# Patient Record
Sex: Male | Born: 1975 | Race: Black or African American | Hispanic: No | Marital: Single | Smoking: Never smoker
Health system: Southern US, Community
[De-identification: ages and names within clinical notes are randomized; demographics above are authoritative.]

## PROBLEM LIST (undated history)

## (undated) DIAGNOSIS — I10 Essential (primary) hypertension: Secondary | ICD-10-CM

## (undated) DIAGNOSIS — D849 Immunodeficiency, unspecified: Secondary | ICD-10-CM

## (undated) HISTORY — PX: KNEE SURGERY: SHX244

---

## 2014-10-26 ENCOUNTER — Emergency Department (HOSPITAL_BASED_OUTPATIENT_CLINIC_OR_DEPARTMENT_OTHER)
Admission: EM | Admit: 2014-10-26 | Discharge: 2014-10-26 | Disposition: A | Discharge status: Home / Self Care | Payer: Self-pay | Attending: Emergency Medicine | Admitting: Emergency Medicine

## 2014-10-26 ENCOUNTER — Encounter (HOSPITAL_BASED_OUTPATIENT_CLINIC_OR_DEPARTMENT_OTHER): Payer: Self-pay

## 2014-10-26 DIAGNOSIS — S8992XD Unspecified injury of left lower leg, subsequent encounter: Secondary | ICD-10-CM | POA: Insufficient documentation
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
## 2014-10-26 NOTE — ED Notes (Signed)
Pt verbalizes understanding of d/c instructions and denies any further needs at this time. 
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
## 2018-11-25 ENCOUNTER — Other Ambulatory Visit: Payer: Self-pay

## 2018-11-25 ENCOUNTER — Encounter (HOSPITAL_COMMUNITY): Payer: Self-pay

## 2018-11-25 ENCOUNTER — Ambulatory Visit (HOSPITAL_COMMUNITY)
Admission: EM | Admit: 2018-11-25 | Discharge: 2018-11-25 | Disposition: A | Discharge status: Home / Self Care | Payer: Self-pay | Attending: Family Medicine | Admitting: Family Medicine

## 2018-11-25 DIAGNOSIS — Z202 Contact with and (suspected) exposure to infections with a predominantly sexual mode of transmission: Secondary | ICD-10-CM | POA: Insufficient documentation

## 2018-11-25 NOTE — Discharge Instructions (Addendum)
We are running a trichomonas test.  It should be back tomorrow.  It seems very odd that your partner would all of sudden test positive for trichomonas with a normal test earlier in the pregnancy and you are monogamous (no other partner).  sometimes the test that that is done with a turn around result in an hour is wrong.    At any rate, most people with this minor infection have no symptoms. Often, people try to blame one another when we really don't know when it began or who gave it to whom.  The reason we don't do the one hour test here is that it is not as reliable as the test we are currently running.

## 2018-11-25 NOTE — ED Provider Notes (Signed)
[facility]    CSN: [id number] Arrival date & time: 11/25/18  1321      History   Chief Complaint Chief Complaint  Patient presents with  . SEXUALLY TRANSMITTED DISEASE    HPI Edward Benjamin is a 43 y.o. male.  This is his initial visit to [facility] urgent care. HPI Pt states his partner told him she tested positive for a STD. And he needs to be tested. History reviewed. No pertinent past medical history.  There are no active problems to display for this patient.   History reviewed. No pertinent surgical history.     Home Medications    Prior to Admission medications   Not on File    Family History History reviewed. No pertinent family history.  Social History Social History   Tobacco Use  . Smoking status: Never Smoker  . Smokeless tobacco: Never Used  Substance Use Topics  . Alcohol use: Yes  . Drug use: Never     Allergies   Patient has no known allergies.   Review of Systems Review of Systems  Genitourinary: Negative.   All other systems reviewed and are negative.    Physical Exam Triage Vital Signs ED Triage Vitals [11/25/18 1433]  Enc Vitals Group     BP 137/83     Pulse Rate 63     Resp 18     Temp 98 F (36.7 C)     Temp Source Oral     SpO2      Weight 245 lb (111.1 kg)     Height      Head Circumference      Peak Flow      Pain Score 0     Pain Loc      Pain Edu?      Excl. in GC?    No data found.  Updated Vital Signs BP 137/83 (BP Location: Right Arm)   Pulse 63   Temp 98 F (36.7 C) (Oral)   Resp 18   Wt 111.1 kg   BMI 33.23 kg/m   Physical Exam Vitals signs and nursing note reviewed.  Constitutional:      Appearance: Normal appearance.  Pulmonary:     Effort: Pulmonary effort is normal.  Genitourinary:    Penis: Normal.   Skin:    General: Skin is warm and dry.  Neurological:     General: No focal deficit present.     Mental Status: He is alert and oriented to person, place, and time.   Psychiatric:        Mood and Affect: Mood normal.      UC Treatments / Results  Labs (all labs ordered are listed, but only abnormal results are displayed) Labs Reviewed  CYTOLOGY, (ORAL, ANAL, URETHRAL) ANCILLARY ONLY    EKG   Radiology No results found.  Procedures Procedures (including critical care time)  Medications Ordered in UC Medications - No data to display  Initial Impression / Assessment and Plan / UC Course  I have reviewed the triage vital signs and the nursing notes.  Pertinent labs & imaging results that were available during my care of the patient were reviewed by me and considered in my medical decision making (see chart for details).    Final Clinical Impressions(s) / UC Diagnoses   Final diagnoses:  Possible exposure to STD     Discharge Instructions     We are running a trichomonas test.  It should be back tomorrow.  It seems very odd that your partner would all of sudden test positive for trichomonas with a normal test earlier in the pregnancy and you are monogamous (no other partner).  sometimes the test that that is done with a turn around result in an hour is wrong.    At any rate, most people with this minor infection have no symptoms. Often, people try to blame one another when we really don't know when it began or who gave it to whom.  The reason we don't do the one hour test here is that it is not as reliable as the test we are currently running.    ED Prescriptions    None     I have reviewed the PDMP during this encounter.   Robyn Haber, MD 11/25/18 [phone]

## 2018-11-25 NOTE — ED Triage Notes (Signed)
Pt states his partner told him she tested positive for a STD. And he needs to be tested.

## 2018-11-29 LAB — CYTOLOGY, (ORAL, ANAL, URETHRAL) ANCILLARY ONLY
Chlamydia: NEGATIVE
Neisseria Gonorrhea: NEGATIVE
Trichomonas: NEGATIVE

## 2021-04-01 ENCOUNTER — Encounter (HOSPITAL_BASED_OUTPATIENT_CLINIC_OR_DEPARTMENT_OTHER): Payer: Self-pay

## 2021-04-01 ENCOUNTER — Other Ambulatory Visit: Payer: Self-pay

## 2021-04-01 ENCOUNTER — Emergency Department (HOSPITAL_BASED_OUTPATIENT_CLINIC_OR_DEPARTMENT_OTHER)
Admission: EM | Admit: 2021-04-01 | Discharge: 2021-04-01 | Disposition: A | Discharge status: Home / Self Care | Payer: BC Managed Care – PPO | Attending: Emergency Medicine | Admitting: Emergency Medicine

## 2021-04-01 ENCOUNTER — Emergency Department (HOSPITAL_BASED_OUTPATIENT_CLINIC_OR_DEPARTMENT_OTHER): Payer: BC Managed Care – PPO

## 2021-04-01 DIAGNOSIS — X501XXA Overexertion from prolonged static or awkward postures, initial encounter: Secondary | ICD-10-CM | POA: Insufficient documentation

## 2021-04-01 DIAGNOSIS — S99911A Unspecified injury of right ankle, initial encounter: Secondary | ICD-10-CM | POA: Diagnosis present

## 2021-04-01 DIAGNOSIS — S93401A Sprain of unspecified ligament of right ankle, initial encounter: Secondary | ICD-10-CM | POA: Insufficient documentation

## 2021-04-01 MED ORDER — OXYCODONE-ACETAMINOPHEN 5-325 MG PO TABS
1.0000 | ORAL_TABLET | Freq: Once | ORAL | Status: AC
  Administered 2021-04-01: 1 via ORAL
  Filled 2021-04-01: qty 1

## 2021-04-01 NOTE — Discharge Instructions (Addendum)
Follow-up with orthopedic doctor or sports medicine.  Recommend ice, elevate and rest your ankle.  Would recommend bearing weight as tolerated.  Use crutches as needed.

## 2021-04-01 NOTE — ED Notes (Signed)
Ice applied to the Right ankle. Instructed after 30 or so mins take it off for about 10 mins and then reapply. Also instructed to keep it elevated.

## 2021-04-01 NOTE — ED Triage Notes (Addendum)
Pt states he twisted right ankle ~645pm

## 2021-04-01 NOTE — ED Provider Notes (Signed)
[facility] EMERGENCY DEPARTMENT Provider Note   CSN: [id number] Arrival date & time: 04/01/21  2029     History  Chief Complaint  Patient presents with   Ankle Injury    Edward Benjamin is a 46 y.o. male.  Presents to ER with concern for ankle injury.  Patient reports that he twisted his ankle around 6:45 PM.  Right ankle.  Noted sudden onset pain and some swelling.  Pain is worse to the outside of his ankle.  Pain is worse with movement improved with rest.  No alleviating or other aggravating factors.  Denies prior injury to this ankle.  HPI     Home Medications Prior to Admission medications   Not on File      Allergies    Kale and Spinach    Review of Systems   Review of Systems  Musculoskeletal:  Positive for arthralgias.  All other systems reviewed and are negative.  Physical Exam Updated Vital Signs BP (!) 136/97 (BP Location: Left Arm)    Pulse 82    Temp 98.2 F (36.8 C) (Oral)    Resp 18    Ht 6' (1.829 m)    Wt 118.4 kg    SpO2 99%    BMI 35.40 kg/m  Physical Exam Vitals and nursing note reviewed.  Constitutional:      General: He is not in acute distress.    Appearance: He is well-developed.  HENT:     Head: Normocephalic and atraumatic.  Eyes:     Conjunctiva/sclera: Conjunctivae normal.  Cardiovascular:     Rate and Rhythm: Normal rate and regular rhythm.     Heart sounds: No murmur heard. Pulmonary:     Effort: Pulmonary effort is normal. No respiratory distress.  Musculoskeletal:     Cervical back: Neck supple.     Comments: Right lower extremity: There is swelling and tenderness to the lateral malleolus of the right ankle, normal DP and PT pulses, no tenderness to foot, remainder of lower leg or knee or upper leg.  Skin:    General: Skin is warm and dry.     Capillary Refill: Capillary refill takes less than 2 seconds.  Neurological:     Mental Status: He is alert.  Psychiatric:        Mood and Affect: Mood normal.    ED Results  / Procedures / Treatments   Labs (all labs ordered are listed, but only abnormal results are displayed) Labs Reviewed - No data to display  EKG None  Radiology DG Ankle Complete Right  Result Date: 04/01/2021 CLINICAL DATA:  Status post trauma. EXAM: RIGHT ANKLE - COMPLETE 3+ VIEW COMPARISON:  None. FINDINGS: There is no evidence of fracture, dislocation, or joint effusion. There is no evidence of arthropathy or other focal bone abnormality. There is mild anterior and lateral soft tissue swelling. IMPRESSION: Mild anterior and lateral soft tissue swelling, without an acute osseous abnormality. Electronically Signed   By: Aram Candela M.D.   On: 04/01/2021 21:20    Procedures Procedures    Medications Ordered in ED Medications  oxyCODONE-acetaminophen (PERCOCET/ROXICET) 5-325 MG per tablet 1 tablet (1 tablet Oral Given 04/01/21 2315)    ED Course/ Medical Decision Making/ A&P                           Medical Decision Making Amount and/or Complexity of Data Reviewed Radiology: ordered.  Risk Prescription drug management.   46 year old  presenting to ER with concern for isolated right ankle injury.  Mechanical, no LOC.  Noted mild swelling and tenderness.  X-ray independently reviewed by myself.  Reviewed radiology report.  No acute fracture or dislocation.  Suspect strain, sprain.  Will place in ankle brace ASO and provide crutches.  Recommend RICE, f/u with sports med or ortho.     After the discussed management above, the patient was determined to be safe for discharge.  The patient was in agreement with this plan and all questions regarding their care were answered.  ED return precautions were discussed and the patient will return to the ED with any significant worsening of condition.         Final Clinical Impression(s) / ED Diagnoses Final diagnoses:  Sprain of right ankle, unspecified ligament, initial encounter    Rx / DC Orders ED Discharge Orders      None         Milagros Loll, MD 04/02/21 1724

## 2021-04-03 ENCOUNTER — Encounter: Payer: Self-pay | Admitting: Family Medicine

## 2021-04-03 ENCOUNTER — Ambulatory Visit (INDEPENDENT_AMBULATORY_CARE_PROVIDER_SITE_OTHER): Payer: BC Managed Care – PPO | Admitting: Family Medicine

## 2021-04-03 ENCOUNTER — Ambulatory Visit: Payer: Self-pay

## 2021-04-03 VITALS — BP 150/90 | Ht 72.0 in | Wt 261.0 lb

## 2021-04-03 DIAGNOSIS — S93491A Sprain of other ligament of right ankle, initial encounter: Secondary | ICD-10-CM

## 2021-04-03 DIAGNOSIS — M958 Other specified acquired deformities of musculoskeletal system: Secondary | ICD-10-CM | POA: Insufficient documentation

## 2021-04-03 DIAGNOSIS — R937 Abnormal findings on diagnostic imaging of other parts of musculoskeletal system: Secondary | ICD-10-CM | POA: Insufficient documentation

## 2021-04-03 DIAGNOSIS — M25571 Pain in right ankle and joints of right foot: Secondary | ICD-10-CM

## 2021-04-03 MED ORDER — IBUPROFEN 600 MG PO TABS: 600.0000 mg | ORAL_TABLET | Freq: Three times a day (TID) | ORAL | 1 refills | Status: AC | PRN

## 2021-04-03 MED ORDER — MELOXICAM 7.5 MG PO TABS: 7.5000 mg | ORAL_TABLET | Freq: Two times a day (BID) | ORAL | 1 refills | Status: DC | PRN

## 2021-04-03 NOTE — Progress Notes (Signed)
°  Edward Benjamin - 46 y.o. male MRN [id number]  Date of birth: 09-20-1975  SUBJECTIVE:  Including CC & ROS.  No chief complaint on file.   Edward Benjamin is a 46 y.o. male that is presenting with acute right ankle pain.  He had a plantarflexed injury.  He is having pain more in the distal tibia.  Has pain with weightbearing.  No history of surgery.  Review of the emergency department note from 2/22 shows he was provided crutches. Independent review of the right ankle x-ray from 2/22 shows soft tissue swelling.   Review of Systems See HPI   HISTORY: Past Medical, Surgical, Social, and Family History Reviewed & Updated per EMR.   Pertinent Historical Findings include:  History reviewed. No pertinent past medical history.  Past Surgical History:  Procedure Laterality Date   KNEE SURGERY       PHYSICAL EXAM:  VS: BP (!) 150/90 (BP Location: Left Arm, Patient Position: Sitting)    Ht 6' (1.829 m)    Wt 261 lb (118.4 kg)    BMI 35.40 kg/m  Physical Exam Gen: NAD, alert, cooperative with exam, well-appearing MSK:  Neurovascularly intact    Limited ultrasound: Right foot and ankle :  No ankle effusion. Changes of the ATFL is present. No changes of the peroneal tendon or posterior tibialis. Increased hyperemia at the syndesmosis to suspect high ankle sprain.  Summary: Findings consistent with high ankle sprain  Ultrasound and interpretation by Clare Gandy, MD    ASSESSMENT & PLAN:   High ankle sprain of right lower extremity Acutely occurring.  Symptoms were consistent with a high ankle sprain. -Counseled on home exercise therapy and supportive care. -Cam walker. -Provided work note. -Ibuprofen. -Follow-up in 2 weeks.

## 2021-04-03 NOTE — Patient Instructions (Signed)
Nice to meet you Please try the boot  Please use the mobic as needed  Please ice 2-3 times per day for 20-3- minutes  Please try the range of motion movements.   Please send me a message in MyChart with any questions or updates.  Please see me back in 2 weeks.   --Dr. Raeford Razor

## 2021-04-03 NOTE — Assessment & Plan Note (Signed)
Acutely occurring.  Symptoms were consistent with a high ankle sprain. -Counseled on home exercise therapy and supportive care. -Cam walker. -Provided work note. -Ibuprofen. -Follow-up in 2 weeks.

## 2021-04-17 ENCOUNTER — Ambulatory Visit (HOSPITAL_BASED_OUTPATIENT_CLINIC_OR_DEPARTMENT_OTHER)
Admission: RE | Admit: 2021-04-17 | Discharge: 2021-04-17 | Disposition: A | Discharge status: Home / Self Care | Payer: BC Managed Care – PPO | Source: Ambulatory Visit | Attending: Family Medicine | Admitting: Family Medicine

## 2021-04-17 ENCOUNTER — Other Ambulatory Visit: Payer: Self-pay

## 2021-04-17 ENCOUNTER — Encounter: Payer: Self-pay | Admitting: Family Medicine

## 2021-04-17 ENCOUNTER — Ambulatory Visit (INDEPENDENT_AMBULATORY_CARE_PROVIDER_SITE_OTHER): Payer: BC Managed Care – PPO | Admitting: Family Medicine

## 2021-04-17 VITALS — BP 140/90 | Ht 72.0 in | Wt 261.0 lb

## 2021-04-17 DIAGNOSIS — M958 Other specified acquired deformities of musculoskeletal system: Secondary | ICD-10-CM

## 2021-04-17 NOTE — Progress Notes (Signed)
?  Edward Benjamin - 46 y.o. male MRN [id number]  Date of birth: [birth year] ? ?SUBJECTIVE:  Including CC & ROS.  ?No chief complaint on file. ? ? ?Edward Benjamin is a 46 y.o. male that is presenting with worsening of his right ankle pain.  He is unable to bear weight without significant pain.  He has no limited range of motion of his ankle.  He continues to use the crutches in the cam walker. ? ? ? ?Review of Systems ?See HPI  ? ?HISTORY: Past Medical, Surgical, Social, and Family History Reviewed & Updated per EMR.   ?Pertinent Historical Findings include: ? ?History reviewed. No pertinent past medical history. ? ?Past Surgical History:  ?Procedure Laterality Date  ? KNEE SURGERY    ? ? ? ?PHYSICAL EXAM:  ?VS: BP 140/90 (BP Location: Left Arm, Patient Position: Sitting)   Ht 6' (1.829 m)   Wt 261 lb (118.4 kg)   BMI 35.40 kg/m?  ?Physical Exam ?Gen: NAD, alert, cooperative with exam, well-appearing ?MSK:  ?Right ankle: ?Effusion within the ankle joint. ?Limited plantarflexion and dorsiflexion. ?Tenderness to palpation over the ankle joint. ?Translation with anterior drawer. ?Neurovascularly intact   ? ? ? ? ?ASSESSMENT & PLAN:  ? ?Osteochondral defect of ankle ?Still having severe significant pain.  Is unable to bear weight after his trauma from a few weeks ago.  Concern for an osteochondral defect or nondisplaced fracture given his severity of his pain. ?-Counseled on home exercise therapy and supportive care. ?-Continue cam walker and crutches. ?-X-ray. ?-MRI of the right ankle to evaluate for osteochondral defect. ? ? ? ? ?

## 2021-04-17 NOTE — Assessment & Plan Note (Signed)
Still having severe significant pain.  Is unable to bear weight after his trauma from a few weeks ago.  Concern for an osteochondral defect or nondisplaced fracture given his severity of his pain. ?-Counseled on home exercise therapy and supportive care. ?-Continue cam walker and crutches. ?-X-ray. ?-MRI of the right ankle to evaluate for osteochondral defect. ?

## 2021-04-17 NOTE — Patient Instructions (Signed)
Good to see you ?Please continue the boot  ?I will call with the results from today  ?Please call [phone] to schedule the MRI   ?Please send me a message in MyChart with any questions or updates.  ?We'll setup a virtual visit once the MRI is resulted.  ? ?--Dr. Jordan Likes ? ?

## 2021-04-20 ENCOUNTER — Telehealth: Payer: Self-pay | Admitting: Family Medicine

## 2021-04-20 NOTE — Telephone Encounter (Signed)
Informed of results.  ? ?Myra Rude, MD ?[facility] ?04/20/2021, 4:53 PM ? ?

## 2021-04-28 ENCOUNTER — Other Ambulatory Visit: Payer: BC Managed Care – PPO

## 2021-04-29 ENCOUNTER — Other Ambulatory Visit: Payer: Self-pay

## 2021-04-29 ENCOUNTER — Ambulatory Visit
Admission: RE | Admit: 2021-04-29 | Discharge: 2021-04-29 | Disposition: A | Discharge status: Home / Self Care | Payer: BC Managed Care – PPO | Source: Ambulatory Visit | Attending: Family Medicine | Admitting: Family Medicine

## 2021-04-29 DIAGNOSIS — M958 Other specified acquired deformities of musculoskeletal system: Secondary | ICD-10-CM

## 2021-05-04 ENCOUNTER — Encounter: Payer: Self-pay | Admitting: Family Medicine

## 2021-05-04 ENCOUNTER — Telehealth (INDEPENDENT_AMBULATORY_CARE_PROVIDER_SITE_OTHER): Payer: BC Managed Care – PPO | Admitting: Family Medicine

## 2021-05-04 DIAGNOSIS — R937 Abnormal findings on diagnostic imaging of other parts of musculoskeletal system: Secondary | ICD-10-CM | POA: Diagnosis not present

## 2021-05-04 NOTE — Progress Notes (Signed)
Virtual Visit via Video Note ? ?I connected with Edward Benjamin on 05/04/21 at  1:10 PM EDT by a video enabled telemedicine application and verified that I am speaking with the correct person using two identifiers. ? ?Location: ?Patient: home ?Provider: office ?  ?I discussed the limitations of evaluation and management by telemedicine and the availability of in person appointments. The patient expressed understanding and agreed to proceed. ? ?History of Present Illness: ? ?Mr. Edward Benjamin is a 46 year old that is following up after the MRI of his right ankle.  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  It is also showing subchondral marrow edema and cystic changes of the tibial plafond. ?  ?Observations/Objective: ? ? ?Assessment and Plan: ? ?Subchondral marrow edema of right tibial plafond.: ?Initial injury on 2/22.  MRI was revealing for subchondral marrow edema within the tibial plafond.  This is likely the source of his pain with his mechanism of injury.  The chronically torn ligaments are less likely as a source. ?-Counseled on home exercise therapy and supportive care. ?-Allow him to rehab from home for another 2 weeks.  If at that time continues to have pain we can consider injection. ? ?Follow Up Instructions: ? ?  ?I discussed the assessment and treatment plan with the patient. The patient was provided an opportunity to ask questions and all were answered. The patient agreed with the plan and demonstrated an understanding of the instructions. ?  ?The patient was advised to call back or seek an in-person evaluation if the symptoms worsen or if the condition fails to improve as anticipated. ? ? ? ?Clearance Coots, MD ? ? ?

## 2021-05-04 NOTE — Assessment & Plan Note (Signed)
Initial injury on 2/22.  MRI was revealing for subchondral marrow edema within the tibial plafond.  This is likely the source of his pain with his mechanism of injury.  The chronically torn ligaments are less likely as a source. ?-Counseled on home exercise therapy and supportive care. ?-Allow him to rehab from home for another 2 weeks.  If at that time continues to have pain we can consider injection. ?

## 2021-05-06 ENCOUNTER — Ambulatory Visit (INDEPENDENT_AMBULATORY_CARE_PROVIDER_SITE_OTHER): Payer: BC Managed Care – PPO | Admitting: Family Medicine

## 2021-05-06 ENCOUNTER — Encounter: Payer: Self-pay | Admitting: Family Medicine

## 2021-05-06 ENCOUNTER — Ambulatory Visit: Payer: Self-pay

## 2021-05-06 VITALS — BP 138/98 | Ht 72.0 in | Wt 261.0 lb

## 2021-05-06 DIAGNOSIS — M25511 Pain in right shoulder: Secondary | ICD-10-CM

## 2021-05-06 DIAGNOSIS — M778 Other enthesopathies, not elsewhere classified: Secondary | ICD-10-CM

## 2021-05-06 NOTE — Assessment & Plan Note (Signed)
Acutely occurring.  Symptoms seem more consistent with a capsulitis with the changes appreciated on ultrasound. ?-Counseled on home exercise therapy and supportive care. ?-Could consider physical therapy or shockwave therapy. ?

## 2021-05-06 NOTE — Progress Notes (Signed)
?  Edward Benjamin - 46 y.o. male MRN [id number]  Date of birth: 1975/05/29 ? ?SUBJECTIVE:  Including CC & ROS.  ?No chief complaint on file. ? ? ?Edward Benjamin is a 46 y.o. male that is presenting with acute on chronic right shoulder pain.  The pain is anterior in nature.  The pain is worse with overhead activities.  The pain also occurs when he sleeps on the affected side.  It was worse with repetitive activities that he did overhead at work. ? ? ?Review of Systems ?See HPI  ? ?HISTORY: Past Medical, Surgical, Social, and Family History Reviewed & Updated per EMR.   ?Pertinent Historical Findings include: ? ?History reviewed. No pertinent past medical history. ? ?Past Surgical History:  ?Procedure Laterality Date  ? KNEE SURGERY    ? ? ? ?PHYSICAL EXAM:  ?VS: BP (!) 138/98 (BP Location: Left Arm, Patient Position: Sitting)   Ht 6' (1.829 m)   Wt 261 lb (118.4 kg)   BMI 35.40 kg/m?  ?Physical Exam ?Gen: NAD, alert, cooperative with exam, well-appearing ?MSK:  ?Neurovascularly intact   ? ?Limited ultrasound: Right shoulder: ? ?Normal-appearing biceps tendon. ?Normal-appearing subscapularis. ?There is increased thickening at the insertion of the biceps tendon into the capsule. ?Normal-appearing posterior glenohumeral joint. ? ?Summary: Findings most consistent with capsulitis. ? ?Ultrasound and interpretation by Clare Gandy, MD ? ? ? ?ASSESSMENT & PLAN:  ? ?Capsulitis of right shoulder ?Acutely occurring.  Symptoms seem more consistent with a capsulitis with the changes appreciated on ultrasound. ?-Counseled on home exercise therapy and supportive care. ?-Could consider physical therapy or shockwave therapy. ? ? ? ? ?

## 2021-05-06 NOTE — Patient Instructions (Signed)
Good to see you ?Please try heat before exercise and ice after  ?Please try the exercise  ?Please focus on building the strength of your back   ?Please send me a message in MyChart with any questions or updates.  ?Please see me back in 4 weeks.  ? ?--Dr. Jordan Likes ? ?

## 2021-05-18 ENCOUNTER — Ambulatory Visit (INDEPENDENT_AMBULATORY_CARE_PROVIDER_SITE_OTHER): Payer: BC Managed Care – PPO | Admitting: Family Medicine

## 2021-05-18 ENCOUNTER — Ambulatory Visit: Payer: Self-pay

## 2021-05-18 ENCOUNTER — Ambulatory Visit: Payer: BC Managed Care – PPO | Admitting: Family Medicine

## 2021-05-18 VITALS — BP 138/90 | Ht 72.0 in | Wt 260.0 lb

## 2021-05-18 DIAGNOSIS — M19071 Primary osteoarthritis, right ankle and foot: Secondary | ICD-10-CM

## 2021-05-18 MED ORDER — TRIAMCINOLONE ACETONIDE 40 MG/ML IJ SUSP
40.0000 mg | Freq: Once | INTRAMUSCULAR | Status: AC
  Administered 2021-05-18: 40 mg via INTRA_ARTICULAR

## 2021-05-18 NOTE — Progress Notes (Signed)
?  Edward Benjamin - 46 y.o. male MRN [id number]  Date of birth: 1975-08-10 ? ?SUBJECTIVE:  Including CC & ROS.  ?No chief complaint on file. ? ? ?Edward Benjamin is a 46 y.o. male that is presenting with worsening of his right ankle pain.  He is not able to bear weight.  He continues to have pain with any plantarflexion or dorsiflexion.  MRI was showing degenerative changes within the ankle joint. ? ? ?Review of Systems ?See HPI  ? ?HISTORY: Past Medical, Surgical, Social, and Family History Reviewed & Updated per EMR.   ?Pertinent Historical Findings include: ? ?No past medical history on file. ? ?Past Surgical History:  ?Procedure Laterality Date  ? KNEE SURGERY    ? ? ? ?PHYSICAL EXAM:  ?VS: BP 138/90   Ht 6' (1.829 m)   Wt 260 lb (117.9 kg)   BMI 35.26 kg/m?  ?Physical Exam ?Gen: NAD, alert, cooperative with exam, well-appearing ?MSK:  ?Neurovascularly intact   ? ? ?Aspiration/Injection Procedure Note ?Elissa Hefty ?[birth year] ? ?Procedure: Injection ?Indications: Right ankle pain ? ?Procedure Details ?Consent: Risks of procedure as well as the alternatives and risks of each were explained to the (patient/caregiver).  Consent for procedure obtained. ?Time Out: Verified patient identification, verified procedure, site/side was marked, verified correct patient position, special equipment/implants available, medications/allergies/relevent history reviewed, required imaging and test results available.  Performed.  The area was cleaned with iodine and alcohol swabs.   ? ?The right ankle joint was injected using 2 cc of 1% lidocaine on a 25 1-1/2 inch needle.  The syringe was switched and a mixture containing 1 cc's of 40 mg Kenalog and 2 cc's of 0.25% bupivacaine was injected.  Ultrasound was used. Images were obtained in long views showing the injection.   ? ? ?A sterile dressing was applied. ? ?Patient did tolerate procedure well. ? ? ? ? ?ASSESSMENT & PLAN:  ? ?Primary osteoarthritis of right ankle ?Pain is acutely  worsening.  The MRI was showing moderate to high-grade thinning of the far posterior tibial plafond cartilage. Pain occurring with weight bearing and movements.  ?-Counseled on home exercise therapy and supportive care. ?-Injection today. ?-May need consider referral to surgery ? ? ? ? ?

## 2021-05-18 NOTE — Assessment & Plan Note (Signed)
Pain is acutely worsening.  The MRI was showing moderate to high-grade thinning of the far posterior tibial plafond cartilage. Pain occurring with weight bearing and movements.  ?-Counseled on home exercise therapy and supportive care. ?-Injection today. ?-May need consider referral to surgery ?

## 2021-05-18 NOTE — Patient Instructions (Signed)
Good to see you ?Please use ice as needed   ?Please send me a message in MyChart with any questions or updates.  ?Please see me back in 2-3 weeks.  ? ?--Dr. Jordan Likes ? ?

## 2021-06-04 ENCOUNTER — Encounter: Payer: Self-pay | Admitting: Family Medicine

## 2021-06-04 ENCOUNTER — Ambulatory Visit (INDEPENDENT_AMBULATORY_CARE_PROVIDER_SITE_OTHER): Payer: Self-pay | Admitting: Family Medicine

## 2021-06-04 VITALS — BP 148/116 | Ht 72.0 in | Wt 260.0 lb

## 2021-06-04 DIAGNOSIS — M19071 Primary osteoarthritis, right ankle and foot: Secondary | ICD-10-CM

## 2021-06-04 NOTE — Progress Notes (Signed)
?  Edward Benjamin - 46 y.o. male MRN [id number]  Date of birth: [birth year] ? ?SUBJECTIVE:  Including CC & ROS.  ?No chief complaint on file. ? ? ?Edward Benjamin is a 46 y.o. male that is following up for his right ankle pain.  He continues to have pain and is unable to bear weight.  We have tried therapy as well as injection.  He continues to use crutches. ? ? ?Review of Systems ?See HPI  ? ?HISTORY: Past Medical, Surgical, Social, and Family History Reviewed & Updated per EMR.   ?Pertinent Historical Findings include: ? ?History reviewed. No pertinent past medical history. ? ?Past Surgical History:  ?Procedure Laterality Date  ? KNEE SURGERY    ? ? ? ?PHYSICAL EXAM:  ?VS: BP (!) 148/116 (BP Location: Left Arm, Patient Position: Sitting)   Ht 6' (1.829 m)   Wt 260 lb (117.9 kg)   BMI 35.26 kg/m?  ?Physical Exam ?Gen: NAD, alert, cooperative with exam, well-appearing ?MSK:  ?Neurovascularly intact   ? ? ? ? ?ASSESSMENT & PLAN:  ? ?Primary osteoarthritis of right ankle ?Pain still occurring since February after his initial injury.  We have tried therapy, crutches with a cam walker as well as injection pain is still ongoing. ?-Counseled on home exercise therapy and supportive care. ?-Referral to orthopedic surgery. ? ? ? ? ?

## 2021-06-04 NOTE — Patient Instructions (Signed)
Good to see you ?Please use ice as needed  ?Please use crutches if painful  ?I have referred you to the surgeon   ?Please send me a message in MyChart with any questions or updates.  ?Please see me back as needed.  ? ?--Dr. Jordan Likes ? ?

## 2021-06-04 NOTE — Assessment & Plan Note (Signed)
Pain still occurring since February after his initial injury.  We have tried therapy, crutches with a cam walker as well as injection pain is still ongoing. ?-Counseled on home exercise therapy and supportive care. ?-Referral to orthopedic surgery. ?

## 2021-06-05 ENCOUNTER — Ambulatory Visit: Payer: BC Managed Care – PPO | Admitting: Family Medicine

## 2021-06-11 ENCOUNTER — Ambulatory Visit: Payer: Self-pay | Admitting: Family Medicine

## 2021-06-15 ENCOUNTER — Other Ambulatory Visit: Payer: Self-pay

## 2021-06-15 ENCOUNTER — Ambulatory Visit (INDEPENDENT_AMBULATORY_CARE_PROVIDER_SITE_OTHER): Payer: Self-pay | Admitting: Orthopedic Surgery

## 2021-06-15 DIAGNOSIS — M19171 Post-traumatic osteoarthritis, right ankle and foot: Secondary | ICD-10-CM

## 2021-06-16 ENCOUNTER — Encounter: Payer: Self-pay | Admitting: Orthopedic Surgery

## 2021-06-16 DIAGNOSIS — M19171 Post-traumatic osteoarthritis, right ankle and foot: Secondary | ICD-10-CM

## 2021-06-16 MED ORDER — LIDOCAINE HCL 1 % IJ SOLN
2.0000 mL | INTRAMUSCULAR | Status: AC | PRN
  Administered 2021-06-16: 2 mL

## 2021-06-16 MED ORDER — METHYLPREDNISOLONE ACETATE 40 MG/ML IJ SUSP
40.0000 mg | INTRAMUSCULAR | Status: AC | PRN
  Administered 2021-06-16: 40 mg via INTRA_ARTICULAR

## 2021-06-16 NOTE — Progress Notes (Signed)
? ?Office Visit Note ?  ?Patient: Edward Benjamin           ?Date of Birth: Nov 18, 1975           ?MRN: [id number] ?Visit Date: 06/15/2021 ?             ?Requested by: Myra Rude, MD ?[address] ?[address] ?[place],  [place] [address] ?PCP: Patient, No Pcp Per (Inactive) ? ?Chief Complaint  ?Patient presents with  ? Right Ankle - Pain  ? ? ? ? ?HPI: ?Patient is a 46 year old gentleman who is seen for initial evaluation for right ankle pain.  Patient states he is unable to bear weight he has been through therapy and several injections.  He is not sure where the injections were.  He states that this started with a twisting of his ankle in February.  He did have a MRI scan of the ankle March 22.  Patient currently uses crutches for ambulation. ? ?Assessment & Plan: ?Visit Diagnoses:  ?1. Post-traumatic osteoarthritis, right ankle and foot   ? ? ?Plan: The right ankle was injected from the anterior medial portal we will see how he does with this injection.  Discussed patient may benefit from arthroscopic debridement. ? ?Follow-Up Instructions: Return in about 3 weeks (around 07/06/2021).  ? ?Ortho Exam ? ?Patient is alert, oriented, no adenopathy, well-dressed, normal affect, normal respiratory effort. ?Examination patient has a good dorsalis pedis pulse he has swelling over the lateral joint line of the ankle.  The syndesmosis is nontender to palpation or compression the peroneal and posterior tibial tendons are nontender to palpation or with resisted range of motion the anterior tibial tendon is nontender to palpation.  He is point tender to palpation anteriorly over the ankle. ? ?Imaging: ?No results found. ?No images are attached to the encounter. ? ?Labs: ?No results found for: HGBA1C, ESRSEDRATE, CRP, LABURIC, REPTSTATUS, GRAMSTAIN, CULT, LABORGA ? ? ?No results found for: ALBUMIN, PREALBUMIN, CBC ? ?No results found for: MG ?No results found for: VD25OH ? ?No results found for: PREALBUMIN ?   ? View : No  data to display.  ?  ?  ?  ? ? ? ?There is no height or weight on file to calculate BMI. ? ?Orders:  ?No orders of the defined types were placed in this encounter. ? ?No orders of the defined types were placed in this encounter. ? ? ? Procedures: ?Medium Joint Inj: R ankle on 06/16/2021 9:45 AM ?Indications: pain and diagnostic evaluation ?Details: 22 G 1.5 in needle, anteromedial approach ?Medications: 2 mL lidocaine 1 %; 40 mg methylPREDNISolone acetate 40 MG/ML ?Outcome: tolerated well, no immediate complications ?Procedure, treatment alternatives, risks and benefits explained, specific risks discussed. Consent was given by the patient. Immediately prior to procedure a time out was called to verify the correct patient, procedure, equipment, support staff and site/side marked as required. Patient was prepped and draped in the usual sterile fashion.  ? ? ? ?Clinical Data: ?No additional findings. ? ?ROS: ? ?All other systems negative, except as noted in the HPI. ?Review of Systems ? ?Objective: ?Vital Signs: There were no vitals taken for this visit. ? ?Specialty Comments:  ?No specialty comments available. ? ?PMFS History: ?Patient Active Problem List  ? Diagnosis Date Noted  ? Primary osteoarthritis of right ankle 05/18/2021  ? Capsulitis of right shoulder 05/06/2021  ? Bone marrow edema 04/03/2021  ? ?History reviewed. No pertinent past medical history.  ?History reviewed. No pertinent family history.  ?  Past Surgical History:  ?Procedure Laterality Date  ? KNEE SURGERY    ? ?Social History  ? ?Occupational History  ? Not on file  ?Tobacco Use  ? Smoking status: Never  ? Smokeless tobacco: Never  ?Vaping Use  ? Vaping Use: Never used  ?Substance and Sexual Activity  ? Alcohol use: Not Currently  ? Drug use: Never  ? Sexual activity: Yes  ? ? ? ? ? ?

## 2021-07-07 ENCOUNTER — Ambulatory Visit (INDEPENDENT_AMBULATORY_CARE_PROVIDER_SITE_OTHER): Payer: Self-pay | Admitting: Orthopedic Surgery

## 2021-07-07 DIAGNOSIS — M19171 Post-traumatic osteoarthritis, right ankle and foot: Secondary | ICD-10-CM

## 2021-07-08 ENCOUNTER — Encounter: Payer: Self-pay | Admitting: Orthopedic Surgery

## 2021-07-08 NOTE — Progress Notes (Signed)
   Office Visit Note   Patient: Edward Benjamin           Date of Birth: 05-08-1975           MRN: [id number] Visit Date: 07/07/2021              Requested by: No referring provider defined for this encounter. PCP: Patient, No Pcp Per (Inactive)  Chief Complaint  Patient presents with   Right Ankle - Follow-up      HPI: Patient is a 46 year old gentleman who is seen in follow-up for traumatic arthritis right ankle.  Patient states that his ankle felt worse after the steroid injection.  Patient states that he was initially told he had a hairline fracture.  Assessment & Plan: Visit Diagnoses:  1. Post-traumatic osteoarthritis, right ankle and foot     Plan: With the patient's persistent symptoms localized to the tibial talar joint and with the MRI scan findings consistent with osteochondral defects of the tibial talar joint I have recommended proceeding with arthroscopy for debridement of the ankle joint.  Risk and benefits were discussed including persistent pain infection need for additional surgery.  Patient states he understands wished to proceed at this time.  Discussed that our goal would be approximately 75% pain relief.  Follow-Up Instructions: Return in about 2 weeks (around 07/21/2021).   Ortho Exam  Patient is alert, oriented, no adenopathy, well-dressed, normal affect, normal respiratory effort. Examination patient has pain reproduced with palpation of the anterior tibial talar joint.  He has good subtalar and ankle range of motion.  Review of the MRI scan shows no evidence of any stress fractures no syndesmotic injury patient does have osteochondral defects of the tibial talar joint.  Anterior drawer is stable with no ligamentous instability of the anterior talofibular ligament.  Imaging: No results found. No images are attached to the encounter.  Labs: No results found for: HGBA1C, ESRSEDRATE, CRP, LABURIC, REPTSTATUS, GRAMSTAIN, CULT, LABORGA   No results found for:  ALBUMIN, PREALBUMIN, CBC  No results found for: MG No results found for: VD25OH  No results found for: PREALBUMIN     View : No data to display.           There is no height or weight on file to calculate BMI.  Orders:  No orders of the defined types were placed in this encounter.  No orders of the defined types were placed in this encounter.    Procedures: No procedures performed  Clinical Data: No additional findings.  ROS:  All other systems negative, except as noted in the HPI. Review of Systems  Objective: Vital Signs: There were no vitals taken for this visit.  Specialty Comments:  No specialty comments available.  PMFS History: Patient Active Problem List   Diagnosis Date Noted   Primary osteoarthritis of right ankle 05/18/2021   Capsulitis of right shoulder 05/06/2021   Bone marrow edema 04/03/2021   History reviewed. No pertinent past medical history.  History reviewed. No pertinent family history.  Past Surgical History:  Procedure Laterality Date   KNEE SURGERY     Social History   Occupational History   Not on file  Tobacco Use   Smoking status: Never   Smokeless tobacco: Never  Vaping Use   Vaping Use: Never used  Substance and Sexual Activity   Alcohol use: Not Currently   Drug use: Never   Sexual activity: Yes

## 2021-07-27 ENCOUNTER — Ambulatory Visit: Payer: Self-pay | Admitting: Orthopedic Surgery

## 2021-07-30 ENCOUNTER — Encounter (HOSPITAL_COMMUNITY): Payer: Self-pay | Admitting: Orthopedic Surgery

## 2021-07-30 ENCOUNTER — Other Ambulatory Visit: Payer: Self-pay

## 2021-07-30 NOTE — Progress Notes (Addendum)
PCP - pt denies Cardiologist - pt denies   Anesthesia review: n/a  -------------  SDW INSTRUCTIONS:  Your procedure is scheduled on Friday 6/23 . Please report to [facility] Main Entrance "A" at 09:30 A.M., and check in at the Admitting office. Call this number if you have problems the morning of surgery: [phone]   Remember: Do not eat after midnight the night before your surgery  Clear liquids until 0900 the morning of surgery: clear liquids include water, cranberry/apple juice, black coffee, clear tea - please do not add anything to your drinks   Medications to take morning of surgery with a sip of water include: NONE  As of today, STOP taking any Aspirin (unless otherwise instructed by your surgeon), Aleve, Naproxen, Ibuprofen, Motrin, Advil, Goody's, BC's, all herbal medications, fish oil, and all vitamins.    The Morning of Surgery Do not wear jewelry Do not wear lotions, powders, colognes, or deodorant Do not bring valuables to the hospital. [facility] is not responsible for any belongings or valuables.  If you are a smoker, DO NOT Smoke 24 hours prior to surgery  If you wear a CPAP at night please bring your mask the morning of surgery   Remember that you must have someone to transport you home after your surgery, and remain with you for 24 hours if you are discharged the same day.  Please bring cases for contacts, glasses, hearing aids, dentures or bridgework because it cannot be worn into surgery.   Patients discharged the day of surgery will not be allowed to drive home.   Please shower the NIGHT BEFORE/MORNING OF SURGERY (use antibacterial soap like DIAL soap if possible). Wear comfortable clothes the morning of surgery. Oral Hygiene is also important to reduce your risk of infection.  Remember - BRUSH YOUR TEETH THE MORNING OF SURGERY WITH YOUR REGULAR TOOTHPASTE  Patient denies shortness of breath, fever, cough and chest pain.

## 2021-07-31 ENCOUNTER — Ambulatory Visit (HOSPITAL_COMMUNITY): Payer: Self-pay | Admitting: Certified Registered"

## 2021-07-31 ENCOUNTER — Encounter (HOSPITAL_COMMUNITY): Payer: Self-pay | Admitting: Orthopedic Surgery

## 2021-07-31 ENCOUNTER — Ambulatory Visit (HOSPITAL_BASED_OUTPATIENT_CLINIC_OR_DEPARTMENT_OTHER): Payer: Self-pay | Admitting: Certified Registered"

## 2021-07-31 ENCOUNTER — Other Ambulatory Visit: Payer: Self-pay

## 2021-07-31 ENCOUNTER — Ambulatory Visit (HOSPITAL_COMMUNITY)
Admission: RE | Admit: 2021-07-31 | Discharge: 2021-07-31 | Disposition: A | Discharge status: Home / Self Care | Payer: Self-pay | Attending: Orthopedic Surgery | Admitting: Orthopedic Surgery

## 2021-07-31 ENCOUNTER — Encounter (HOSPITAL_COMMUNITY)
Admission: RE | Disposition: A | Discharge status: Home / Self Care | Payer: Self-pay | Source: Home / Self Care | Attending: Orthopedic Surgery

## 2021-07-31 DIAGNOSIS — M1990 Unspecified osteoarthritis, unspecified site: Secondary | ICD-10-CM | POA: Insufficient documentation

## 2021-07-31 DIAGNOSIS — M25871 Other specified joint disorders, right ankle and foot: Secondary | ICD-10-CM | POA: Insufficient documentation

## 2021-07-31 DIAGNOSIS — M19071 Primary osteoarthritis, right ankle and foot: Secondary | ICD-10-CM

## 2021-07-31 HISTORY — PX: ANKLE ARTHROSCOPY: SHX545

## 2021-07-31 LAB — CBC
HCT: 42.8 % (ref 39.0–52.0)
Hemoglobin: 13.9 g/dL (ref 13.0–17.0)
MCH: 26.7 pg (ref 26.0–34.0)
MCHC: 32.5 g/dL (ref 30.0–36.0)
MCV: 82.1 fL (ref 80.0–100.0)
Platelets: 271 10*3/uL (ref 150–400)
RBC: 5.21 MIL/uL (ref 4.22–5.81)
RDW: 14.1 % (ref 11.5–15.5)
WBC: 6 10*3/uL (ref 4.0–10.5)
nRBC: 0 % (ref 0.0–0.2)

## 2021-07-31 LAB — BASIC METABOLIC PANEL
Anion gap: 12 (ref 5–15)
BUN: 16 mg/dL (ref 6–20)
CO2: 22 mmol/L (ref 22–32)
Calcium: 9.3 mg/dL (ref 8.9–10.3)
Chloride: 103 mmol/L (ref 98–111)
Creatinine, Ser: 1.44 mg/dL — ABNORMAL HIGH (ref 0.61–1.24)
GFR, Estimated: >60 mL/min (ref >60)
Glucose, Bld: 89 mg/dL (ref 70–99)
Potassium: 3.9 mmol/L (ref 3.5–5.1)
Sodium: 137 mmol/L (ref 135–145)

## 2021-07-31 SURGERY — ARTHROSCOPY, ANKLE
Anesthesia: Regional | Site: Ankle | Laterality: Right

## 2021-07-31 MED ORDER — KETOROLAC TROMETHAMINE 30 MG/ML IJ SOLN
INTRAMUSCULAR | Status: DC | PRN
  Administered 2021-07-31: 30 mg via INTRAVENOUS

## 2021-07-31 MED ORDER — ONDANSETRON HCL 4 MG/2ML IJ SOLN
INTRAMUSCULAR | Status: DC | PRN
  Administered 2021-07-31: 4 mg via INTRAVENOUS

## 2021-07-31 MED ORDER — CHLORHEXIDINE GLUCONATE 0.12 % MT SOLN
15.0000 mL | Freq: Once | OROMUCOSAL | Status: AC
  Administered 2021-07-31: 15 mL via OROMUCOSAL
  Filled 2021-07-31: qty 15

## 2021-07-31 MED ORDER — MIDAZOLAM HCL 2 MG/2ML IJ SOLN: 2.0000 mg | Freq: Once | INTRAMUSCULAR | Status: AC

## 2021-07-31 MED ORDER — OXYCODONE HCL 5 MG PO TABS
5.0000 mg | ORAL_TABLET | Freq: Once | ORAL | Status: AC | PRN
  Administered 2021-07-31: 5 mg via ORAL

## 2021-07-31 MED ORDER — MIDAZOLAM HCL 2 MG/2ML IJ SOLN
INTRAMUSCULAR | Status: AC
Start: 2021-07-31 — End: ?
  Filled 2021-07-31: qty 2

## 2021-07-31 MED ORDER — FENTANYL CITRATE (PF) 100 MCG/2ML IJ SOLN
INTRAMUSCULAR | Status: AC
  Filled 2021-07-31: qty 2

## 2021-07-31 MED ORDER — ORAL CARE MOUTH RINSE: 15.0000 mL | Freq: Once | OROMUCOSAL | Status: AC

## 2021-07-31 MED ORDER — ONDANSETRON HCL 4 MG/2ML IJ SOLN
INTRAMUSCULAR | Status: AC
Start: 2021-07-31 — End: ?
  Filled 2021-07-31: qty 2

## 2021-07-31 MED ORDER — FENTANYL CITRATE (PF) 100 MCG/2ML IJ SOLN
INTRAMUSCULAR | Status: DC | PRN
Start: 2021-07-31 — End: 2021-07-31
  Administered 2021-07-31: 50 ug via INTRAVENOUS

## 2021-07-31 MED ORDER — KETOROLAC TROMETHAMINE 30 MG/ML IJ SOLN
INTRAMUSCULAR | Status: AC
  Filled 2021-07-31: qty 1

## 2021-07-31 MED ORDER — LIDOCAINE 2% (20 MG/ML) 5 ML SYRINGE
INTRAMUSCULAR | Status: DC | PRN
  Administered 2021-07-31: 40 mg via INTRAVENOUS

## 2021-07-31 MED ORDER — PROPOFOL 10 MG/ML IV BOLUS
INTRAVENOUS | Status: DC | PRN
  Administered 2021-07-31: 20 mg via INTRAVENOUS
  Administered 2021-07-31: 250 mg via INTRAVENOUS

## 2021-07-31 MED ORDER — ONDANSETRON HCL 4 MG/2ML IJ SOLN: 4.0000 mg | Freq: Four times a day (QID) | INTRAMUSCULAR | Status: DC | PRN

## 2021-07-31 MED ORDER — SODIUM CHLORIDE 0.9 % IR SOLN
Status: DC | PRN
  Administered 2021-07-31: 3000 mL

## 2021-07-31 MED ORDER — PROPOFOL 10 MG/ML IV BOLUS
INTRAVENOUS | Status: AC
  Filled 2021-07-31: qty 20

## 2021-07-31 MED ORDER — FENTANYL CITRATE (PF) 100 MCG/2ML IJ SOLN: 25.0000 ug | INTRAMUSCULAR | Status: DC | PRN

## 2021-07-31 MED ORDER — OXYCODONE-ACETAMINOPHEN 5-325 MG PO TABS: 1.0000 | ORAL_TABLET | ORAL | 0 refills | Status: AC | PRN

## 2021-07-31 MED ORDER — 0.9 % SODIUM CHLORIDE (POUR BTL) OPTIME
TOPICAL | Status: DC | PRN
  Administered 2021-07-31: 1000 mL

## 2021-07-31 MED ORDER — MIDAZOLAM HCL 2 MG/2ML IJ SOLN
INTRAMUSCULAR | Status: AC
  Administered 2021-07-31: 2 mg via INTRAVENOUS
  Filled 2021-07-31: qty 2

## 2021-07-31 MED ORDER — GLYCOPYRROLATE 0.2 MG/ML IJ SOLN
INTRAMUSCULAR | Status: DC | PRN
  Administered 2021-07-31: .1 mg via INTRAVENOUS

## 2021-07-31 MED ORDER — FENTANYL CITRATE (PF) 100 MCG/2ML IJ SOLN
INTRAMUSCULAR | Status: AC
  Administered 2021-07-31: 100 ug via INTRAVENOUS
  Filled 2021-07-31: qty 2

## 2021-07-31 MED ORDER — KETAMINE HCL 10 MG/ML IJ SOLN: INTRAMUSCULAR | Status: DC | PRN

## 2021-07-31 MED ORDER — OXYCODONE HCL 5 MG/5ML PO SOLN: 5.0000 mg | Freq: Once | ORAL | Status: AC | PRN

## 2021-07-31 MED ORDER — CEFAZOLIN SODIUM-DEXTROSE 2-4 GM/100ML-% IV SOLN
2.0000 g | INTRAVENOUS | Status: AC
  Administered 2021-07-31: 2 g via INTRAVENOUS
  Filled 2021-07-31: qty 100

## 2021-07-31 MED ORDER — FENTANYL CITRATE (PF) 100 MCG/2ML IJ SOLN: 100.0000 ug | Freq: Once | INTRAMUSCULAR | Status: AC

## 2021-07-31 MED ORDER — LIDOCAINE 2% (20 MG/ML) 5 ML SYRINGE
INTRAMUSCULAR | Status: AC
  Filled 2021-07-31: qty 5

## 2021-07-31 MED ORDER — FENTANYL CITRATE (PF) 250 MCG/5ML IJ SOLN
INTRAMUSCULAR | Status: AC
  Filled 2021-07-31: qty 5

## 2021-07-31 MED ORDER — ROPIVACAINE HCL 5 MG/ML IJ SOLN
INTRAMUSCULAR | Status: DC | PRN
  Administered 2021-07-31: 30 mL via PERINEURAL

## 2021-07-31 MED ORDER — OXYCODONE HCL 5 MG PO TABS
ORAL_TABLET | ORAL | Status: AC
  Filled 2021-07-31: qty 1

## 2021-07-31 MED ORDER — LACTATED RINGERS IV SOLN: INTRAVENOUS | Status: DC

## 2021-07-31 SURGICAL SUPPLY — 45 items
BAG COUNTER SPONGE SURGICOUNT (BAG) ×2 IMPLANT
BLADE CUDA 5.5 (BLADE) IMPLANT
BLADE EXCALIBUR 4.0X13 (MISCELLANEOUS) IMPLANT
BNDG COHESIVE 6X5 TAN NS LF (GAUZE/BANDAGES/DRESSINGS) ×1 IMPLANT
BNDG COHESIVE 6X5 TAN STRL LF (GAUZE/BANDAGES/DRESSINGS) ×1 IMPLANT
BNDG GAUZE DERMACEA FLUFF (GAUZE/BANDAGES/DRESSINGS) ×1
BNDG GAUZE DERMACEA FLUFF 4 (GAUZE/BANDAGES/DRESSINGS) IMPLANT
BUR OVAL 4.0 (BURR) IMPLANT
COVER SURGICAL LIGHT HANDLE (MISCELLANEOUS) ×4 IMPLANT
CUFF TOURN SGL QUICK 34 (TOURNIQUET CUFF)
CUFF TOURN SGL QUICK 42 (TOURNIQUET CUFF) IMPLANT
CUFF TRNQT CYL 34X4.125X (TOURNIQUET CUFF) IMPLANT
DRAPE ARTHROSCOPY W/POUCH 114 (DRAPES) ×2 IMPLANT
DRAPE OEC MINIVIEW 54X84 (DRAPES) IMPLANT
DRAPE U-SHAPE 47X51 STRL (DRAPES) ×2 IMPLANT
DRSG EMULSION OIL 3X3 NADH (GAUZE/BANDAGES/DRESSINGS) ×2 IMPLANT
DRSG PAD ABDOMINAL 8X10 ST (GAUZE/BANDAGES/DRESSINGS) ×1 IMPLANT
DURAPREP 26ML APPLICATOR (WOUND CARE) ×2 IMPLANT
GAUZE PAD ABD 8X10 STRL (GAUZE/BANDAGES/DRESSINGS) ×1 IMPLANT
GAUZE SPONGE 4X4 12PLY STRL (GAUZE/BANDAGES/DRESSINGS) ×2 IMPLANT
GLOVE BIOGEL PI IND STRL 9 (GLOVE) ×1 IMPLANT
GLOVE BIOGEL PI INDICATOR 9 (GLOVE) ×1
GLOVE SURG ORTHO 9.0 STRL STRW (GLOVE) ×2 IMPLANT
GOWN STRL REUS W/ TWL XL LVL3 (GOWN DISPOSABLE) ×3 IMPLANT
GOWN STRL REUS W/TWL XL LVL3 (GOWN DISPOSABLE) ×3
KIT BASIN OR (CUSTOM PROCEDURE TRAY) ×2 IMPLANT
KIT TURNOVER KIT B (KITS) ×2 IMPLANT
MANIFOLD NEPTUNE II (INSTRUMENTS) ×2 IMPLANT
NDL 18GX1X1/2 (RX/OR ONLY) (NEEDLE) ×1 IMPLANT
NEEDLE 18GX1X1/2 (RX/OR ONLY) (NEEDLE) ×2 IMPLANT
PACK ARTHROSCOPY DSU (CUSTOM PROCEDURE TRAY) ×2 IMPLANT
PAD ARMBOARD 7.5X6 YLW CONV (MISCELLANEOUS) ×4 IMPLANT
PADDING CAST COTTON 6X4 STRL (CAST SUPPLIES) ×2 IMPLANT
PORT APPOLLO RF 90DEGREE MULTI (SURGICAL WAND) IMPLANT
SPONGE T-LAP 4X18 ~~LOC~~+RFID (SPONGE) ×2 IMPLANT
SUT ETHILON 4 0 PS 2 18 (SUTURE) ×2 IMPLANT
SUT MNCRL AB 3-0 PS2 18 (SUTURE) IMPLANT
SUT VIC AB 2-0 CT1 27 (SUTURE)
SUT VIC AB 2-0 CT1 TAPERPNT 27 (SUTURE) IMPLANT
SYR 20ML LL LF (SYRINGE) ×2 IMPLANT
TAPE STRIPS DRAPE STRL (GAUZE/BANDAGES/DRESSINGS) IMPLANT
TOWEL GREEN STERILE (TOWEL DISPOSABLE) ×2 IMPLANT
TOWEL GREEN STERILE FF (TOWEL DISPOSABLE) ×2 IMPLANT
TUBING ARTHROSCOPY IRRIG 16FT (MISCELLANEOUS) ×2 IMPLANT
WATER STERILE IRR 1000ML POUR (IV SOLUTION) ×2 IMPLANT

## 2021-07-31 NOTE — Transfer of Care (Signed)
Immediate Anesthesia Transfer of Care Note  Patient: Edward Benjamin  Procedure(s) Performed: RIGHT ANKLE ARTHROSCOPY, DEBRIDEMENT (Right: Ankle)  Patient Location: PACU  Anesthesia Type:General and Regional  Level of Consciousness: drowsy  Airway & Oxygen Therapy: Patient Spontanous Breathing and Patient connected to face mask oxygen  Post-op Assessment: Report given to RN and Post -op Vital signs reviewed and stable  Post vital signs: Reviewed and stable  Last Vitals:  Vitals Value Taken Time  BP 109/65 07/31/21 1330  Temp    Pulse 63 07/31/21 1331  Resp 13 07/31/21 1331  SpO2 100 % 07/31/21 1331  Vitals shown include unvalidated device data.  Last Pain:  Vitals:   07/31/21 1025  PainSc: 0-No pain         Complications: No notable events documented.

## 2021-08-03 ENCOUNTER — Encounter (HOSPITAL_COMMUNITY): Payer: Self-pay | Admitting: Orthopedic Surgery

## 2021-08-13 ENCOUNTER — Ambulatory Visit (INDEPENDENT_AMBULATORY_CARE_PROVIDER_SITE_OTHER): Payer: Self-pay | Admitting: Orthopedic Surgery

## 2021-08-13 DIAGNOSIS — M19171 Post-traumatic osteoarthritis, right ankle and foot: Secondary | ICD-10-CM

## 2021-08-24 ENCOUNTER — Encounter: Payer: Self-pay | Admitting: Orthopedic Surgery

## 2021-08-24 NOTE — Progress Notes (Signed)
Patient came in today to schedule surgery.

## 2021-10-08 ENCOUNTER — Telehealth: Payer: Self-pay | Admitting: Orthopedic Surgery

## 2021-10-08 NOTE — Telephone Encounter (Signed)
Called pt to see if he still has stitches in? Also pt needs to be scheduled a follow up visit for duda to see him.

## 2021-10-08 NOTE — Telephone Encounter (Signed)
This pt needs to have post op appt? Message to the front desk to make appt for next week.

## 2021-10-08 NOTE — Telephone Encounter (Signed)
Francesco Sor financial forms received for [employer]. Please advise work status. Thank you.

## 2021-10-13 NOTE — Telephone Encounter (Signed)
noted 

## 2021-10-13 NOTE — Telephone Encounter (Signed)
Front desk called to sch post op appt. Looks like he has come in once since surgery? Hold on forms until we hear back from the pt. Thanks!

## 2021-10-17 ENCOUNTER — Emergency Department (HOSPITAL_BASED_OUTPATIENT_CLINIC_OR_DEPARTMENT_OTHER)
Admission: EM | Admit: 2021-10-17 | Discharge: 2021-10-17 | Disposition: A | Discharge status: Home / Self Care | Payer: BC Managed Care – PPO | Attending: Emergency Medicine | Admitting: Emergency Medicine

## 2021-10-17 ENCOUNTER — Other Ambulatory Visit: Payer: Self-pay

## 2021-10-17 ENCOUNTER — Encounter (HOSPITAL_BASED_OUTPATIENT_CLINIC_OR_DEPARTMENT_OTHER): Payer: Self-pay | Admitting: Emergency Medicine

## 2021-10-17 ENCOUNTER — Emergency Department (HOSPITAL_BASED_OUTPATIENT_CLINIC_OR_DEPARTMENT_OTHER): Payer: BC Managed Care – PPO

## 2021-10-17 DIAGNOSIS — M25511 Pain in right shoulder: Secondary | ICD-10-CM | POA: Insufficient documentation

## 2021-10-17 DIAGNOSIS — I160 Hypertensive urgency: Secondary | ICD-10-CM | POA: Diagnosis not present

## 2021-10-17 DIAGNOSIS — G8929 Other chronic pain: Secondary | ICD-10-CM | POA: Insufficient documentation

## 2021-10-17 HISTORY — DX: Essential (primary) hypertension: I10

## 2021-10-17 HISTORY — DX: Immunodeficiency, unspecified: D84.9

## 2021-10-17 MED ORDER — KETOROLAC TROMETHAMINE 30 MG/ML IJ SOLN
30.0000 mg | Freq: Once | INTRAMUSCULAR | Status: DC
  Filled 2021-10-17: qty 1

## 2021-10-17 MED ORDER — NAPROXEN 375 MG PO TABS
375.0000 mg | ORAL_TABLET | Freq: Two times a day (BID) | ORAL | 0 refills | Status: AC
Start: 2021-10-17 — End: ?

## 2021-10-17 MED ORDER — NAPROXEN 250 MG PO TABS
375.0000 mg | ORAL_TABLET | Freq: Once | ORAL | Status: AC
  Administered 2021-10-17: 375 mg via ORAL
  Filled 2021-10-17: qty 2

## 2021-10-17 NOTE — ED Triage Notes (Signed)
Patient states that he has pain in his rt shoulder states that it has been hurting for a year. States that he seen a doctor and he states that he seen something in his shoulder but he never went back. States left side shoulder pain started 3-4 months ago. States pain radiates down arm.

## 2021-10-17 NOTE — Discharge Instructions (Addendum)
Be sure to follow-up with your primary care physician about your elevated blood pressure and to get this rechecked and discuss potential medications.  For your shoulder, you are being prescribed an anti-inflammatory.  Do not take other NSAIDs such as ibuprofen, Aleve, Advil, etc.  You may still take Tylenol in combination with this.

## 2021-10-17 NOTE — ED Notes (Signed)
ED Provider at bedside. 

## 2021-10-17 NOTE — ED Notes (Addendum)
Patient denies any chest pain ,blurred vision numbness or tingling in his extremities. States that he has had numbness and tingling before but not today. States that he has medication for hypertension .but is not currently taking it

## 2021-10-17 NOTE — ED Provider Notes (Signed)
[facility] EMERGENCY DEPARTMENT Provider Note   CSN: [id number] Arrival date & time: 10/17/21  5427     History  Chief Complaint  Patient presents with   Shoulder Pain    Edward Benjamin is a 46 y.o. male.  HPI 46 year old male presents with right shoulder pain.  He states that he has been dealing with this for over a year but since last night has been much more severe.  He states that at times he will have pain in the left shoulder and in his neck but the main pain today is his right shoulder.  Sometimes radiates down his arm.  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  Last night, he was unable to sleep well due to this pain and thinks he laid on it wrong.  No numbness or weakness in the hand or arm.  No chest pain or shortness of breath.  He took extra strength Tylenol without relief last night.  Home Medications Prior to Admission medications   Medication Sig Start Date End Date Taking? Authorizing Provider  naproxen (NAPROSYN) 375 MG tablet Take 1 tablet (375 mg total) by mouth 2 (two) times daily. 10/17/21  Yes Pricilla Loveless, MD  ibuprofen (ADVIL) 600 MG tablet Take 1 tablet (600 mg total) by mouth every 8 (eight) hours as needed. 04/03/21   Myra Rude, MD  oxyCODONE-acetaminophen (PERCOCET/ROXICET) 5-325 MG tablet Take 1 tablet by mouth every 4 (four) hours as needed. 07/31/21   Nadara Mustard, MD      Allergies    Kale and Spinach    Review of Systems   Review of Systems  Respiratory:  Negative for shortness of breath.   Cardiovascular:  Negative for chest pain.  Musculoskeletal:  Positive for arthralgias.  Neurological:  Negative for weakness and numbness.    Physical Exam Updated Vital Signs BP (!) 135/93   Pulse 68   Temp 98.1 F (36.7 C) (Oral)   Resp 17   SpO2 96%  Physical Exam Vitals and nursing note reviewed.  Constitutional:      Appearance: He is well-developed.  HENT:      Head: Normocephalic and atraumatic.  Cardiovascular:     Rate and Rhythm: Normal rate and regular rhythm.     Pulses:          Radial pulses are 2+ on the right side.     Heart sounds: Normal heart sounds.  Pulmonary:     Effort: Pulmonary effort is normal.     Breath sounds: Normal breath sounds.  Abdominal:     General: There is no distension.  Musculoskeletal:     Right shoulder: Tenderness (mild, superior) present. Decreased range of motion.     Right upper arm: No tenderness.     Right elbow: Normal range of motion. No tenderness.     Comments: Normal strength/sensation in right hand  Skin:    General: Skin is warm and dry.  Neurological:     Mental Status: He is alert.     ED Results / Procedures / Treatments   Labs (all labs ordered are listed, but only abnormal results are displayed) Labs Reviewed - No data to display  EKG None  Radiology DG Shoulder Right  Result Date: 10/17/2021 CLINICAL DATA:  Right shoulder pain.  Pain for 1 year. EXAM: RIGHT SHOULDER - 2+ VIEW COMPARISON:  None Available. FINDINGS: There  is no evidence of fracture or dislocation. There is no evidence of arthropathy or other focal bone abnormality. Soft tissues are unremarkable. IMPRESSION: Negative. Electronically Signed   By: Marin Roberts M.D.   On: 10/17/2021 11:00    Procedures Procedures    Medications Ordered in ED Medications  naproxen (NAPROSYN) tablet 375 mg (375 mg Oral Given 10/17/21 1039)    ED Course/ Medical Decision Making/ A&P                           Medical Decision Making Amount and/or Complexity of Data Reviewed Radiology: ordered and independent interpretation performed.    Details: No fractures or dislocation.  Risk Prescription drug management.   Patient was given naproxen here (declined IM Toradol).  Otherwise, appears to be neurovascular intact.  This appears to primarily be a shoulder issue.  He is also hypertensive though no chest pain or other  emergent signs/symptoms.  His blood pressure has come down and now is around 137/90 on repeat testing by myself.  At this point, he will need to follow-up with orthopedics and he has seen Dr. Lajoyce Corners in the past so we will refer to that practice.  No signs of an emergent condition however.  Will discharge with NSAIDs and follow-up with Ortho.        Final Clinical Impression(s) / ED Diagnoses Final diagnoses:  Chronic pain in right shoulder  Hypertensive urgency    Rx / DC Orders ED Discharge Orders          Ordered    naproxen (NAPROSYN) 375 MG tablet  2 times daily        10/17/21 1147              Pricilla Loveless, MD 10/17/21 1148

## 2021-12-15 ENCOUNTER — Ambulatory Visit: Payer: BC Managed Care – PPO | Admitting: Orthopedic Surgery

## 2022-01-27 ENCOUNTER — Other Ambulatory Visit: Payer: Self-pay | Admitting: Podiatry

## 2022-01-27 ENCOUNTER — Ambulatory Visit (INDEPENDENT_AMBULATORY_CARE_PROVIDER_SITE_OTHER): Payer: Worker's Compensation | Admitting: Podiatry

## 2022-01-27 ENCOUNTER — Ambulatory Visit (INDEPENDENT_AMBULATORY_CARE_PROVIDER_SITE_OTHER): Payer: Self-pay

## 2022-01-27 DIAGNOSIS — M79671 Pain in right foot: Secondary | ICD-10-CM

## 2022-01-27 DIAGNOSIS — M779 Enthesopathy, unspecified: Secondary | ICD-10-CM

## 2022-01-27 DIAGNOSIS — M778 Other enthesopathies, not elsewhere classified: Secondary | ICD-10-CM

## 2022-01-27 MED ORDER — ACETAMINOPHEN-CODEINE 300-30 MG PO TABS: 1.0000 | ORAL_TABLET | ORAL | 0 refills | Status: AC | PRN

## 2022-01-27 NOTE — Progress Notes (Signed)
Subjective:  Patient ID: Edward Benjamin, male    DOB: [birth year],  MRN: [id number]  Chief Complaint  Patient presents with   Foot Pain    Right foot injury  Pt stated that he needs to get an MRI done     46 y.o. male presents with the above complaint.  Patient presents with right dorsal ankle injury with a history of ankle arthroscopy by Dr. Lajoyce Corners.  Patient had a pole dropped onto his foot on October 17 and has been hurting since then.  He has not immobilized it he has tried all conservative care.  He wanted to get it evaluated he has not gotten an MRI done.  He would like to discuss treatment options for it.  He has been to urgent care as well.  Hurts with ambulation and hurts with pressure.  Pain scale 7 out of 10.   Review of Systems: Negative except as noted in the HPI. Denies N/V/F/Ch.  Past Medical History:  Diagnosis Date   Hypertension    Immune deficiency disorder (HCC)     Current Outpatient Medications:    acetaminophen-codeine (TYLENOL #3) 300-30 MG tablet, Take 1-2 tablets by mouth every 4 (four) hours as needed for moderate pain., Disp: 30 tablet, Rfl: 0   ibuprofen (ADVIL) 600 MG tablet, Take 1 tablet (600 mg total) by mouth every 8 (eight) hours as needed., Disp: 45 tablet, Rfl: 1   naproxen (NAPROSYN) 375 MG tablet, Take 1 tablet (375 mg total) by mouth 2 (two) times daily., Disp: 20 tablet, Rfl: 0   oxyCODONE-acetaminophen (PERCOCET/ROXICET) 5-325 MG tablet, Take 1 tablet by mouth every 4 (four) hours as needed., Disp: 30 tablet, Rfl: 0  Social History   Tobacco Use  Smoking Status Never  Smokeless Tobacco Never    Allergies  Allergen Reactions   Kale Itching   Spinach Itching   Objective:  There were no vitals filed for this visit. There is no height or weight on file to calculate BMI. Constitutional Well developed. Well nourished.  Vascular Dorsalis pedis pulses palpable bilaterally. Posterior tibial pulses palpable bilaterally. Capillary refill normal  to all digits.  No cyanosis or clubbing noted. Pedal hair growth normal.  Neurologic Normal speech. Oriented to person, place, and time. Epicritic sensation to light touch grossly present bilaterally.  Dermatologic Nails well groomed and normal in appearance. No open wounds. No skin lesions.  Orthopedic: Pain on palpation to the anterior ankle.  No pain with range of motion of the ankle joint some crepitus noted with ankle joint.  Positive Silfverskiold test with gastrocnemius equinus.  No pain at the Achilles tendon peroneal tendon posterior tibial tendon.   Radiographs: 3 views of skeletally mature adult right ankle: Uneven joint space narrowing slightly noted at the ankle joint.  Mild midfoot arthritis noted.  Pes planovalgus foot structure noted.  No other bony abnormalities identified.  No fractures noted. Assessment:   1. Right foot pain   2. Extensor tendinitis of foot    Plan:  Patient was evaluated and treated and all questions answered.  Right extensor tendinitis versus ankle injury -All questions and concerns were discussed with the patient in extensive detail. -At this time because it is continued to hurt I instructed him to continue using cam boot to immobilize ankle joint to allow it 40 to heal.  I will also place an order for an MRI as he has failed multiple conservative care and would like to discuss other options. -MRI was ordered to rule out  tearing  No follow-ups on file.

## 2022-03-08 ENCOUNTER — Telehealth: Payer: Self-pay | Admitting: Podiatry

## 2022-03-08 NOTE — Telephone Encounter (Signed)
[facility] is reaching out to see when pt will be scheduled for MRI? Documentation of authorizations is in chart.  Please advise

## 2022-03-15 ENCOUNTER — Ambulatory Visit
Admission: RE | Admit: 2022-03-15 | Discharge: 2022-03-15 | Disposition: A | Discharge status: Home / Self Care | Payer: Worker's Compensation | Source: Ambulatory Visit | Attending: Podiatry | Admitting: Podiatry

## 2022-03-15 DIAGNOSIS — M778 Other enthesopathies, not elsewhere classified: Secondary | ICD-10-CM

## 2022-03-29 ENCOUNTER — Telehealth: Payer: Self-pay | Admitting: *Deleted

## 2022-03-29 NOTE — Telephone Encounter (Signed)
Patient is calling for results of MRI, please advise.

## 2022-04-15 ENCOUNTER — Ambulatory Visit (INDEPENDENT_AMBULATORY_CARE_PROVIDER_SITE_OTHER): Payer: Worker's Compensation | Admitting: Podiatry

## 2022-04-15 DIAGNOSIS — G5731 Lesion of lateral popliteal nerve, right lower limb: Secondary | ICD-10-CM

## 2022-04-15 DIAGNOSIS — M792 Neuralgia and neuritis, unspecified: Secondary | ICD-10-CM | POA: Diagnosis not present

## 2022-04-15 MED ORDER — GABAPENTIN 300 MG PO CAPS: 300.0000 mg | ORAL_CAPSULE | Freq: Three times a day (TID) | ORAL | 3 refills | Status: DC

## 2022-04-15 NOTE — Progress Notes (Signed)
  Subjective:  Patient ID: Edward Benjamin, male    DOB: 03-Jun-1975,  MRN: [id number]  Chief Complaint  Patient presents with   Foot Pain    MRI results     47 y.o. male presents with the above complaint.  Patient presents with right superficial peroneal neuritis/numbness.  Patient states that it is painful to walk and has progressive gotten worse wanted to get it evaluated he has not seen anyone as prior to seeing me.  Some medication can help.  He has not taken any medication.  He would like to discuss treatment options for this.   Review of Systems: Negative except as noted in the HPI. Denies N/V/F/Ch.  Past Medical History:  Diagnosis Date   Hypertension    Immune deficiency disorder ([place])     Current Outpatient Medications:    acetaminophen-codeine (TYLENOL #3) 300-30 MG tablet, Take 1-2 tablets by mouth every 4 (four) hours as needed for moderate pain., Disp: 30 tablet, Rfl: 0   gabapentin (NEURONTIN) 300 MG capsule, Take 1 capsule (300 mg total) by mouth 3 (three) times daily., Disp: 90 capsule, Rfl: 3   ibuprofen (ADVIL) 600 MG tablet, Take 1 tablet (600 mg total) by mouth every 8 (eight) hours as needed., Disp: 45 tablet, Rfl: 1   naproxen (NAPROSYN) 375 MG tablet, Take 1 tablet (375 mg total) by mouth 2 (two) times daily., Disp: 20 tablet, Rfl: 0   oxyCODONE-acetaminophen (PERCOCET/ROXICET) 5-325 MG tablet, Take 1 tablet by mouth every 4 (four) hours as needed., Disp: 30 tablet, Rfl: 0  Social History   Tobacco Use  Smoking Status Never  Smokeless Tobacco Never    Allergies  Allergen Reactions   Kale Itching   Spinach Itching   Objective:  There were no vitals filed for this visit. There is no height or weight on file to calculate BMI. Constitutional Well developed. Well nourished.  Vascular Dorsalis pedis pulses palpable bilaterally. Posterior tibial pulses palpable bilaterally. Capillary refill normal to all digits.  No cyanosis or clubbing noted. Pedal hair  growth normal.  Neurologic Normal speech. Oriented to person, place, and time. Positive Tinel's sign noted to the superficial peroneal nerve.  Negative Tinel's sign to tarsal tunnel or common peroneal nerve.  Unable to appreciate underlying exostosis  Dermatologic Nails well groomed and normal in appearance. No open wounds. No skin lesions.  Orthopedic: Within normal limits.   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 Neuritis    Plan:  Patient was evaluated and treated and all questions answered.  Right superficial peroneal nerve/neuritis -All questions and concerns were discussed with the patient in extensive detail -I discussed shoe gear modification in extensive detail.  Given the local presence of Tinel's sign to the superficial peroneal nerve patient will benefit from diagnostic block.  Patient agrees with the plan to proceed with diagnostic block to rule in compression syndrome -After obtaining consent, and per orders of Dr. Boneta Lucks, injection of one-to-one mixture of half percent Marcaine plain 1% lidocaine plain given by Felipa Furnace. Patient instructed to remain in clinic for 20 minutes afterwards, and to report any adverse reaction to me immediately. -I will reevaluate. Co. visit.  Patient will also benefit from nerve conduction study if positive.

## 2022-04-20 ENCOUNTER — Ambulatory Visit (INDEPENDENT_AMBULATORY_CARE_PROVIDER_SITE_OTHER): Payer: Worker's Compensation | Admitting: Podiatry

## 2022-04-20 DIAGNOSIS — G5731 Lesion of lateral popliteal nerve, right lower limb: Secondary | ICD-10-CM

## 2022-04-20 DIAGNOSIS — M792 Neuralgia and neuritis, unspecified: Secondary | ICD-10-CM

## 2022-04-20 MED ORDER — GABAPENTIN 300 MG PO CAPS: 300.0000 mg | ORAL_CAPSULE | Freq: Three times a day (TID) | ORAL | 3 refills | Status: AC

## 2022-04-20 NOTE — Progress Notes (Signed)
  Subjective:  Patient ID: Edward Benjamin, male    DOB: Jun 08, 1975,  MRN: [id number]  Chief Complaint  Patient presents with   Foot Pain    47 y.o. male presents with the above complaint.  Patient presents with right superficial peroneal neuritis/numbness.  He states that shot helped considerably.  However it only lasted for like a day or so.  The pain started coming back gradually and started causing more pain.  Denies any other acute complaints.  Review of Systems: Negative except as noted in the HPI. Denies N/V/F/Ch.  Past Medical History:  Diagnosis Date   Hypertension    Immune deficiency disorder ([place])     Current Outpatient Medications:    acetaminophen-codeine (TYLENOL #3) 300-30 MG tablet, Take 1-2 tablets by mouth every 4 (four) hours as needed for moderate pain., Disp: 30 tablet, Rfl: 0   gabapentin (NEURONTIN) 300 MG capsule, Take 1 capsule (300 mg total) by mouth 3 (three) times daily., Disp: 90 capsule, Rfl: 3   ibuprofen (ADVIL) 600 MG tablet, Take 1 tablet (600 mg total) by mouth every 8 (eight) hours as needed., Disp: 45 tablet, Rfl: 1   naproxen (NAPROSYN) 375 MG tablet, Take 1 tablet (375 mg total) by mouth 2 (two) times daily., Disp: 20 tablet, Rfl: 0   oxyCODONE-acetaminophen (PERCOCET/ROXICET) 5-325 MG tablet, Take 1 tablet by mouth every 4 (four) hours as needed., Disp: 30 tablet, Rfl: 0  Social History   Tobacco Use  Smoking Status Never  Smokeless Tobacco Never    Allergies  Allergen Reactions   Kale Itching   Spinach Itching   Objective:  There were no vitals filed for this visit. There is no height or weight on file to calculate BMI. Constitutional Well developed. Well nourished.  Vascular Dorsalis pedis pulses palpable bilaterally. Posterior tibial pulses palpable bilaterally. Capillary refill normal to all digits.  No cyanosis or clubbing noted. Pedal hair growth normal.  Neurologic Normal speech. Oriented to person, place, and  time. Positive Tinel's sign noted to the superficial peroneal nerve.  Negative Tinel's sign to tarsal tunnel or common peroneal nerve.  Unable to appreciate underlying exostosis  Dermatologic Nails well groomed and normal in appearance. No open wounds. No skin lesions.  Orthopedic: Within normal limits.   Radiographs: None Assessment:   1. Neuropathy of right superficial peroneal nerve    Plan:  Patient was evaluated and treated and all questions answered.  Right superficial peroneal nerve/neuritis -All questions and concerns were discussed with the patient in extensive detail -Clinically patient's pain has improved considerably.  At this time patient may be experiencing nerve entrapment/neuritis syndrome. -He will benefit from gabapentin and nerve conduction study.  I discussed the treatment options for both of these he states understanding in place with nerve conduction study

## 2022-04-22 ENCOUNTER — Telehealth: Payer: Self-pay | Admitting: Podiatry

## 2022-04-30 ENCOUNTER — Encounter: Payer: Self-pay | Admitting: Neurology

## 2022-04-30 ENCOUNTER — Other Ambulatory Visit: Payer: Self-pay

## 2022-04-30 DIAGNOSIS — G5731 Lesion of lateral popliteal nerve, right lower limb: Secondary | ICD-10-CM

## 2022-05-06 ENCOUNTER — Telehealth: Payer: Self-pay | Admitting: Podiatry

## 2022-05-06 NOTE — Telephone Encounter (Signed)
Mr. Oba received a call from Dr. Narda Amber office about his neurology appt. They do not accept workers [employer] He would like to know if he can be referred somewhere else.

## 2022-05-10 ENCOUNTER — Telehealth: Payer: Self-pay | Admitting: Podiatry

## 2022-05-10 NOTE — Telephone Encounter (Signed)
Pt's worker's comp agent, Garnette Scheuermann, called to get the notes from pt's last visit on 3/12 with Dr. Posey Pronto. She is needing notes from the MRI findings in reference to the foot only. She would like notes faxed to [phone], claim # [id number] & to the attention of Garnette Scheuermann. Please advise

## 2022-05-13 ENCOUNTER — Encounter: Payer: Self-pay | Admitting: Neurology

## 2022-05-24 ENCOUNTER — Encounter: Payer: Self-pay | Admitting: *Deleted

## 2022-05-28 ENCOUNTER — Telehealth: Payer: Self-pay | Admitting: *Deleted

## 2022-05-28 NOTE — Telephone Encounter (Signed)
Patient is calling to schedule a f/u appointment,please call to schedule.

## 2022-06-08 NOTE — Telephone Encounter (Signed)
error 

## 2022-06-09 ENCOUNTER — Encounter: Payer: Self-pay | Admitting: Podiatry

## 2022-06-09 ENCOUNTER — Ambulatory Visit: Payer: Worker's Compensation | Admitting: Podiatry

## 2022-06-09 DIAGNOSIS — G5731 Lesion of lateral popliteal nerve, right lower limb: Secondary | ICD-10-CM

## 2022-06-09 NOTE — Progress Notes (Signed)
  Subjective:  Patient ID: Edward Benjamin, male    DOB: 05/09/1975,  MRN: [id number]  Chief Complaint  Patient presents with   Neuropathy of right superficial peroneal nerve    Pt stated that nothing has changed everything is the same     47 y.o. male presents with the above complaint.  Patient presents with right superficial peroneal neuritis/numbness.  He would like to discuss next plan.  The pain is about the same.  Gabapentin helps some but not much.  He denies any other acute complaints. Review of Systems: Negative except as noted in the HPI. Denies N/V/F/Ch.  Past Medical History:  Diagnosis Date   Hypertension    Immune deficiency disorder ([facility])     Current Outpatient Medications:    acetaminophen-codeine (TYLENOL #3) 300-30 MG tablet, Take 1-2 tablets by mouth every 4 (four) hours as needed for moderate pain., Disp: 30 tablet, Rfl: 0   gabapentin (NEURONTIN) 300 MG capsule, Take 1 capsule (300 mg total) by mouth 3 (three) times daily., Disp: 90 capsule, Rfl: 3   ibuprofen (ADVIL) 600 MG tablet, Take 1 tablet (600 mg total) by mouth every 8 (eight) hours as needed., Disp: 45 tablet, Rfl: 1   naproxen (NAPROSYN) 375 MG tablet, Take 1 tablet (375 mg total) by mouth 2 (two) times daily., Disp: 20 tablet, Rfl: 0   oxyCODONE-acetaminophen (PERCOCET/ROXICET) 5-325 MG tablet, Take 1 tablet by mouth every 4 (four) hours as needed., Disp: 30 tablet, Rfl: 0  Social History   Tobacco Use  Smoking Status Never  Smokeless Tobacco Never    Allergies  Allergen Reactions   Kale Itching   Spinach Itching   Objective:  There were no vitals filed for this visit. There is no height or weight on file to calculate BMI. Constitutional Well developed. Well nourished.  Vascular Dorsalis pedis pulses palpable bilaterally. Posterior tibial pulses palpable bilaterally. Capillary refill normal to all digits.  No cyanosis or clubbing noted. Pedal hair growth normal.  Neurologic Normal  speech. Oriented to person, place, and time. Positive Tinel's sign noted to the superficial peroneal nerve.  Negative Tinel's sign to tarsal tunnel or common peroneal nerve.  Unable to appreciate underlying exostosis  Dermatologic Nails well groomed and normal in appearance. No open wounds. No skin lesions.  Orthopedic: Within normal limits.   Radiographs: None Assessment:   1. Neuropathy of right superficial peroneal nerve   2. 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  Right superficial peroneal nerve/neuritis -All questions and concerns were discussed with the patient in extensive detail -Clinically patient's pain has improved considerably.  At this time patient may be experiencing nerve entrapment/neuritis syndrome. -Continue gabapentin -Nerve conduction study shows involvement of superficial peroneal nerve with mild mononeuropathy. -I believe patient at this time will benefit from PRP injection to help decrease acute inflammatory component around the nerve. -Will give Worker's Comp. to work on the approval.

## 2022-06-16 ENCOUNTER — Ambulatory Visit (INDEPENDENT_AMBULATORY_CARE_PROVIDER_SITE_OTHER): Payer: Self-pay | Admitting: Podiatry

## 2022-06-16 ENCOUNTER — Other Ambulatory Visit: Payer: Self-pay

## 2022-06-16 DIAGNOSIS — G5731 Lesion of lateral popliteal nerve, right lower limb: Secondary | ICD-10-CM

## 2022-06-16 NOTE — Progress Notes (Signed)
5 cc of PRP injection was injected in standard technique near the superficial peroneal nerve.  No complication noted no pinpoint bleeding noted

## 2022-07-08 ENCOUNTER — Telehealth: Payer: Self-pay | Admitting: Podiatry

## 2022-07-08 NOTE — Telephone Encounter (Signed)
Pt was calling to see how long after the 30 day wait can he get the PRP injection? Pt work scheduled has changed and can no longer make it on 07/14/22. And he was told only a nurse can make this appt?   Please advise

## 2022-07-14 ENCOUNTER — Ambulatory Visit (INDEPENDENT_AMBULATORY_CARE_PROVIDER_SITE_OTHER): Payer: Worker's Compensation | Admitting: Podiatry

## 2022-07-14 ENCOUNTER — Encounter: Payer: Self-pay | Admitting: Podiatry

## 2022-07-14 DIAGNOSIS — G5731 Lesion of lateral popliteal nerve, right lower limb: Secondary | ICD-10-CM

## 2022-07-14 MED ORDER — ACETAMINOPHEN-CODEINE 300-30 MG PO TABS: 1.0000 | ORAL_TABLET | ORAL | 0 refills | Status: AC | PRN

## 2022-07-14 NOTE — Progress Notes (Signed)
5 cc of the epi injection was given in standard technique.  No complication noted.  Patient tolerated injection well.

## 2022-07-16 ENCOUNTER — Encounter: Payer: Self-pay | Admitting: Podiatry

## 2022-08-03 ENCOUNTER — Telehealth: Payer: Self-pay | Admitting: Podiatry

## 2022-08-03 NOTE — Telephone Encounter (Signed)
Pt requesting a in creased on his Tylenol 3   Please advise

## 2022-08-04 MED ORDER — ACETAMINOPHEN-CODEINE 300-60 MG PO TABS: 1.0000 | ORAL_TABLET | ORAL | 0 refills | Status: AC | PRN

## 2022-08-10 ENCOUNTER — Telehealth: Payer: Self-pay | Admitting: Podiatry

## 2022-08-10 NOTE — Telephone Encounter (Signed)
Medical Case Manager called to r/s appt. For PRP. Please give him a callback to r/s

## 2022-08-11 ENCOUNTER — Other Ambulatory Visit: Payer: Self-pay

## 2022-08-11 DIAGNOSIS — G5731 Lesion of lateral popliteal nerve, right lower limb: Secondary | ICD-10-CM

## 2022-08-11 NOTE — Telephone Encounter (Signed)
Case worker: needs to be present of apt. If you can contact [phone] to schedule PRP needs to be in the morning apt any day but Friday

## 2022-08-16 ENCOUNTER — Other Ambulatory Visit: Payer: Self-pay | Admitting: Podiatry

## 2022-08-18 ENCOUNTER — Telehealth: Payer: Self-pay | Admitting: Podiatry

## 2022-08-18 NOTE — Telephone Encounter (Signed)
Pts case mgr Maeola Sarah called to get pt scheduled for the prp treatment that he cxled . I have them scheduled for 7.24 at 1130 with Dr Allena Katz and  told her I would discuss with administrator and if we need to change it we will call her back.

## 2022-08-25 ENCOUNTER — Other Ambulatory Visit: Payer: Self-pay | Admitting: Podiatry

## 2022-08-25 ENCOUNTER — Telehealth: Payer: Self-pay | Admitting: Podiatry

## 2022-08-25 MED ORDER — OXYCODONE-ACETAMINOPHEN 5-325 MG PO TABS: 1.0000 | ORAL_TABLET | ORAL | 0 refills | Status: AC | PRN

## 2022-08-25 NOTE — Telephone Encounter (Signed)
Pt called asking for a refill on his pain medication. He ask for it to be sent to the [facility] in [place] on [address]

## 2022-08-26 NOTE — Telephone Encounter (Signed)
Lvm for pt that medication was sent in yesterday afternoon.

## 2022-09-01 ENCOUNTER — Ambulatory Visit (INDEPENDENT_AMBULATORY_CARE_PROVIDER_SITE_OTHER): Payer: Worker's Compensation | Admitting: Podiatry

## 2022-09-01 DIAGNOSIS — M79673 Pain in unspecified foot: Secondary | ICD-10-CM

## 2022-09-01 DIAGNOSIS — G5731 Lesion of lateral popliteal nerve, right lower limb: Secondary | ICD-10-CM

## 2022-09-01 NOTE — Progress Notes (Signed)
4 cc PRP was given in standard technique no complications noted.

## 2022-09-02 ENCOUNTER — Telehealth: Payer: Self-pay | Admitting: Podiatry

## 2022-09-02 NOTE — Telephone Encounter (Signed)
Pt case manger needs the office note and work status note. And work note Maeola Sarah. [phone] please give her a call she is his medical case manger.

## 2022-09-29 ENCOUNTER — Encounter: Payer: Self-pay | Admitting: Podiatry

## 2022-09-29 ENCOUNTER — Ambulatory Visit (INDEPENDENT_AMBULATORY_CARE_PROVIDER_SITE_OTHER): Payer: Worker's Compensation | Admitting: Podiatry

## 2022-09-29 DIAGNOSIS — G5731 Lesion of lateral popliteal nerve, right lower limb: Secondary | ICD-10-CM | POA: Diagnosis not present

## 2022-09-29 MED ORDER — OXYCODONE-ACETAMINOPHEN 5-325 MG PO TABS: 1.0000 | ORAL_TABLET | ORAL | 0 refills | Status: AC | PRN

## 2022-09-29 NOTE — Progress Notes (Signed)
Subjective:  Patient ID: Edward Benjamin, male    DOB: 1975/12/14,  MRN: [id number]  Chief Complaint  Patient presents with   Peripheral Neuropathy    RM11:Patient is here for PRP F/U KGM:[id number] of right superficial peroneal nerve    47 y.o. male presents with the above complaint.  Patient presents with right superficial peroneal neuritis/numbness.  He would like to discuss next plan.  The pain is about the same.  Gabapentin helps some but not much.  He denies any other acute complaints. Review of Systems: Negative except as noted in the HPI. Denies N/V/F/Ch.  Past Medical History:  Diagnosis Date   Hypertension    Immune deficiency disorder ([facility])     Current Outpatient Medications:    acetaminophen-codeine (TYLENOL #3) 300-30 MG tablet, Take 1-2 tablets by mouth every 4 (four) hours as needed for moderate pain., Disp: 30 tablet, Rfl: 0   acetaminophen-codeine (TYLENOL #3) 300-30 MG tablet, Take 1-2 tablets by mouth every 4 (four) hours as needed for moderate pain., Disp: 30 tablet, Rfl: 0   acetaminophen-codeine (TYLENOL #4) 300-60 MG tablet, Take 1 tablet by mouth every 4 (four) hours as needed for moderate pain., Disp: 30 tablet, Rfl: 0   gabapentin (NEURONTIN) 300 MG capsule, Take 1 capsule (300 mg total) by mouth 3 (three) times daily., Disp: 90 capsule, Rfl: 3   ibuprofen (ADVIL) 600 MG tablet, Take 1 tablet (600 mg total) by mouth every 8 (eight) hours as needed., Disp: 45 tablet, Rfl: 1   naproxen (NAPROSYN) 375 MG tablet, Take 1 tablet (375 mg total) by mouth 2 (two) times daily., Disp: 20 tablet, Rfl: 0   oxyCODONE-acetaminophen (PERCOCET) 5-325 MG tablet, Take 1 tablet by mouth every 4 (four) hours as needed for severe pain., Disp: 30 tablet, Rfl: 0   oxyCODONE-acetaminophen (PERCOCET) 5-325 MG tablet, Take 1 tablet by mouth every 4 (four) hours as needed for severe pain., Disp: 30 tablet, Rfl: 0   oxyCODONE-acetaminophen (PERCOCET/ROXICET) 5-325 MG tablet, Take 1 tablet by  mouth every 4 (four) hours as needed., Disp: 30 tablet, Rfl: 0  Social History   Tobacco Use  Smoking Status Never  Smokeless Tobacco Never    Allergies  Allergen Reactions   Kale Itching   Spinach Itching   Objective:  There were no vitals filed for this visit. There is no height or weight on file to calculate BMI. Constitutional Well developed. Well nourished.  Vascular Dorsalis pedis pulses palpable bilaterally. Posterior tibial pulses palpable bilaterally. Capillary refill normal to all digits.  No cyanosis or clubbing noted. Pedal hair growth normal.  Neurologic Normal speech. Oriented to person, place, and time. Positive Tinel's sign noted to the superficial peroneal nerve.  Negative Tinel's sign to tarsal tunnel or common peroneal nerve.  Unable to appreciate underlying exostosis  Dermatologic Nails well groomed and normal in appearance. No open wounds. No skin lesions.  Orthopedic: Within normal limits.   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 Neuropathy of right superficial peroneal nerve     Plan:  Patient was evaluated and treated and all questions answered.  Right superficial peroneal nerve/neuritis -All questions and concerns were discussed with the patient in extensive detail -Clinically patient's pain has improved considerably.  At this time patient may be experiencing nerve entrapment/neuritis syndrome. -Continue gabapentin -Nerve conduction study shows involvement of superficial peroneal nerve with mild mononeuropathy. -PRP injections were unsuccessful -Patient will benefit from pain management for other pain modality  options including possible of nerve stimulator referral was placed.

## 2022-10-26 ENCOUNTER — Telehealth: Payer: Self-pay | Admitting: Podiatry

## 2022-10-26 NOTE — Telephone Encounter (Signed)
Pt needs pain medication refill

## 2022-10-27 MED ORDER — OXYCODONE-ACETAMINOPHEN 5-325 MG PO TABS
1.0000 | ORAL_TABLET | ORAL | 0 refills | Status: AC | PRN
Start: 2022-10-27 — End: ?

## 2022-10-27 NOTE — Addendum Note (Signed)
Addended by: Nicholes Rough on: 10/27/2022 04:15 PM   Modules accepted: Orders

## 2022-11-02 ENCOUNTER — Telehealth: Payer: Self-pay | Admitting: Podiatry

## 2022-11-02 NOTE — Telephone Encounter (Signed)
Pt called and stated he called last week to get his pain medication refilled but had not heard back.  Upon checking chart it was sent in and we verified the pharmacy was correct. He was expecting a call back but did not get one.

## 2022-12-01 ENCOUNTER — Ambulatory Visit (INDEPENDENT_AMBULATORY_CARE_PROVIDER_SITE_OTHER): Payer: Self-pay | Admitting: Podiatry

## 2022-12-01 ENCOUNTER — Encounter: Payer: Self-pay | Admitting: Podiatry

## 2022-12-01 DIAGNOSIS — G5731 Lesion of lateral popliteal nerve, right lower limb: Secondary | ICD-10-CM

## 2022-12-01 MED ORDER — OXYCODONE-ACETAMINOPHEN 10-325 MG PO TABS: 1.0000 | ORAL_TABLET | ORAL | 0 refills | Status: AC | PRN

## 2022-12-01 NOTE — Progress Notes (Signed)
Subjective:  Patient ID: Edward Benjamin, male    DOB: Nov 01, 1975,  MRN: [id number]  No chief complaint on file.   47 y.o. male presents with the above complaint.  Patient presents with right superficial peroneal neuritis/numbness.  He denies any other acute complaints pain is about the same.  He was unable to go to pain management Review of Systems: Negative except as noted in the HPI. Denies N/V/F/Ch.  Past Medical History:  Diagnosis Date   Hypertension    Immune deficiency disorder ([facility])     Current Outpatient Medications:    acetaminophen-codeine (TYLENOL #3) 300-30 MG tablet, Take 1-2 tablets by mouth every 4 (four) hours as needed for moderate pain., Disp: 30 tablet, Rfl: 0   acetaminophen-codeine (TYLENOL #3) 300-30 MG tablet, Take 1-2 tablets by mouth every 4 (four) hours as needed for moderate pain., Disp: 30 tablet, Rfl: 0   acetaminophen-codeine (TYLENOL #4) 300-60 MG tablet, Take 1 tablet by mouth every 4 (four) hours as needed for moderate pain., Disp: 30 tablet, Rfl: 0   gabapentin (NEURONTIN) 300 MG capsule, Take 1 capsule (300 mg total) by mouth 3 (three) times daily., Disp: 90 capsule, Rfl: 3   ibuprofen (ADVIL) 600 MG tablet, Take 1 tablet (600 mg total) by mouth every 8 (eight) hours as needed., Disp: 45 tablet, Rfl: 1   naproxen (NAPROSYN) 375 MG tablet, Take 1 tablet (375 mg total) by mouth 2 (two) times daily., Disp: 20 tablet, Rfl: 0   oxyCODONE-acetaminophen (PERCOCET) 10-325 MG tablet, Take 1 tablet by mouth every 4 (four) hours as needed for pain., Disp: 30 tablet, Rfl: 0   oxyCODONE-acetaminophen (PERCOCET) 5-325 MG tablet, Take 1 tablet by mouth every 4 (four) hours as needed for severe pain., Disp: 30 tablet, Rfl: 0   oxyCODONE-acetaminophen (PERCOCET) 5-325 MG tablet, Take 1 tablet by mouth every 4 (four) hours as needed for severe pain., Disp: 30 tablet, Rfl: 0   oxyCODONE-acetaminophen (PERCOCET) 5-325 MG tablet, Take 1 tablet by mouth every 4 (four) hours as  needed for severe pain., Disp: 30 tablet, Rfl: 0   oxyCODONE-acetaminophen (PERCOCET/ROXICET) 5-325 MG tablet, Take 1 tablet by mouth every 4 (four) hours as needed., Disp: 30 tablet, Rfl: 0  Social History   Tobacco Use  Smoking Status Never  Smokeless Tobacco Never    Allergies  Allergen Reactions   Kale Itching   Spinach Itching   Objective:  There were no vitals filed for this visit. There is no height or weight on file to calculate BMI. Constitutional Well developed. Well nourished.  Vascular Dorsalis pedis pulses palpable bilaterally. Posterior tibial pulses palpable bilaterally. Capillary refill normal to all digits.  No cyanosis or clubbing noted. Pedal hair growth normal.  Neurologic Normal speech. Oriented to person, place, and time. Positive Tinel's sign noted to the superficial peroneal nerve.  Negative Tinel's sign to tarsal tunnel or common peroneal nerve.  Unable to appreciate underlying exostosis  Dermatologic Nails well groomed and normal in appearance. No open wounds. No skin lesions.  Orthopedic: Within normal limits.   Radiographs: None Assessment:   No diagnosis found.   Plan:  Patient was evaluated and treated and all questions answered.  Right superficial peroneal nerve/neuritis -All questions and concerns were discussed with the patient in extensive detail -Clinically patient's pain has improved considerably.  At this time patient may be experiencing nerve entrapment/neuritis syndrome. -Continue gabapentin -Nerve conduction study shows involvement of superficial peroneal nerve with mild mononeuropathy. -PRP injections were unsuccessful -Awaiting pain management.  Patient was told that prior to getting pain management they will need a second opinion from another physician.  Will await for the second opinion

## 2023-01-12 ENCOUNTER — Ambulatory Visit: Payer: Self-pay | Admitting: Podiatry

## 2023-02-23 ENCOUNTER — Ambulatory Visit: Payer: Worker's Compensation | Admitting: Podiatry

## 2023-02-25 ENCOUNTER — Ambulatory Visit: Payer: Worker's Compensation | Admitting: Podiatry

## 2023-04-15 ENCOUNTER — Other Ambulatory Visit: Payer: Self-pay | Admitting: Podiatry

## 2023-04-15 DIAGNOSIS — G5731 Lesion of lateral popliteal nerve, right lower limb: Secondary | ICD-10-CM

## 2023-06-21 IMAGING — DX DG ANKLE COMPLETE 3+V*R*
3 series · 3 of 3 positions shown · non-contrast
Comparison: None.

CLINICAL DATA: Status post trauma.

EXAM:
RIGHT ANKLE - COMPLETE 3+ VIEW

[ankle ap]
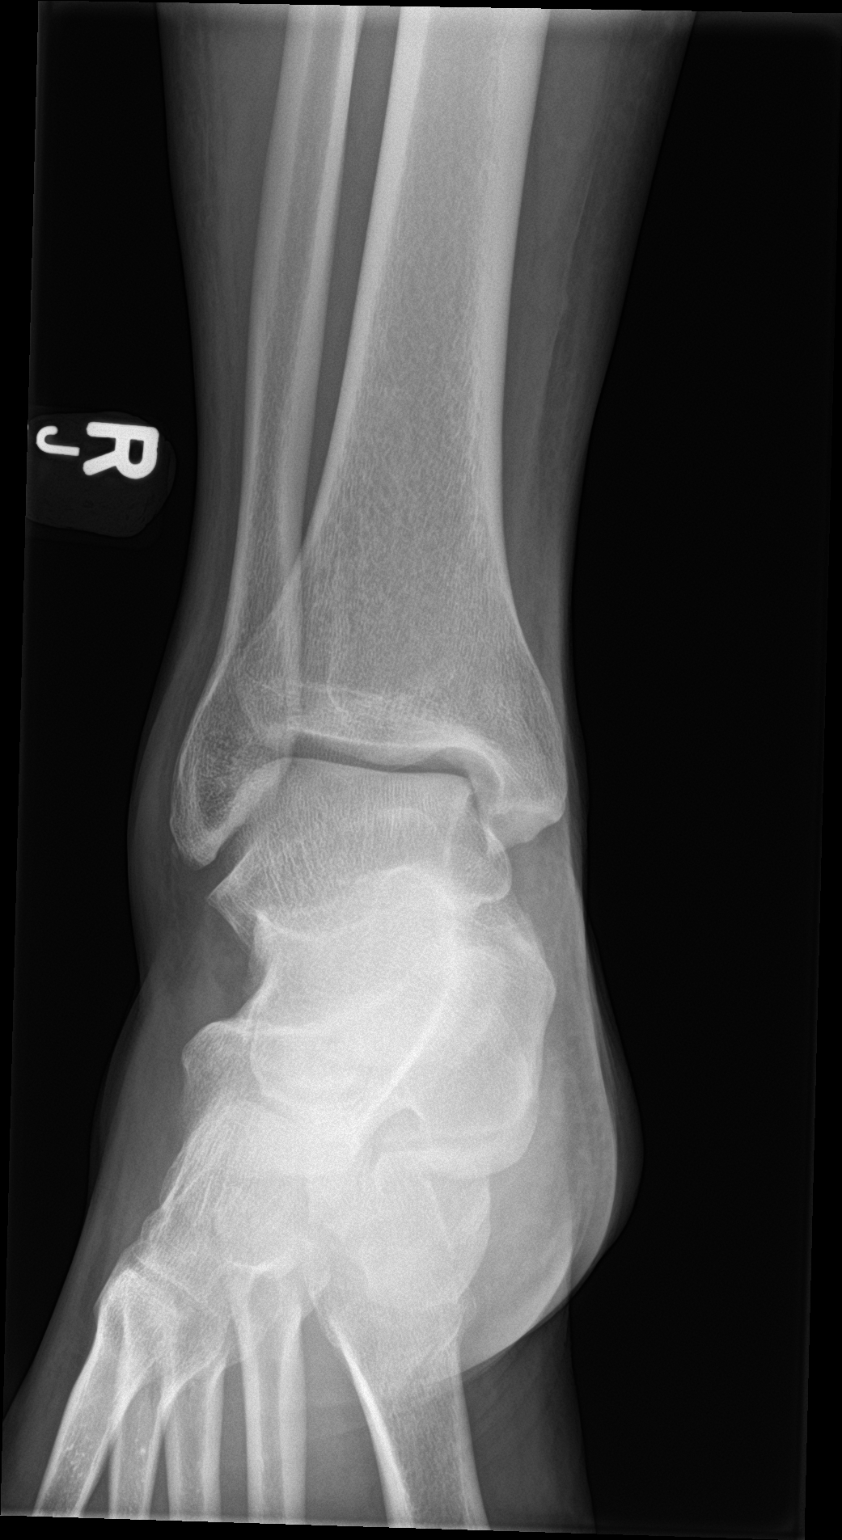

[ankle obl]
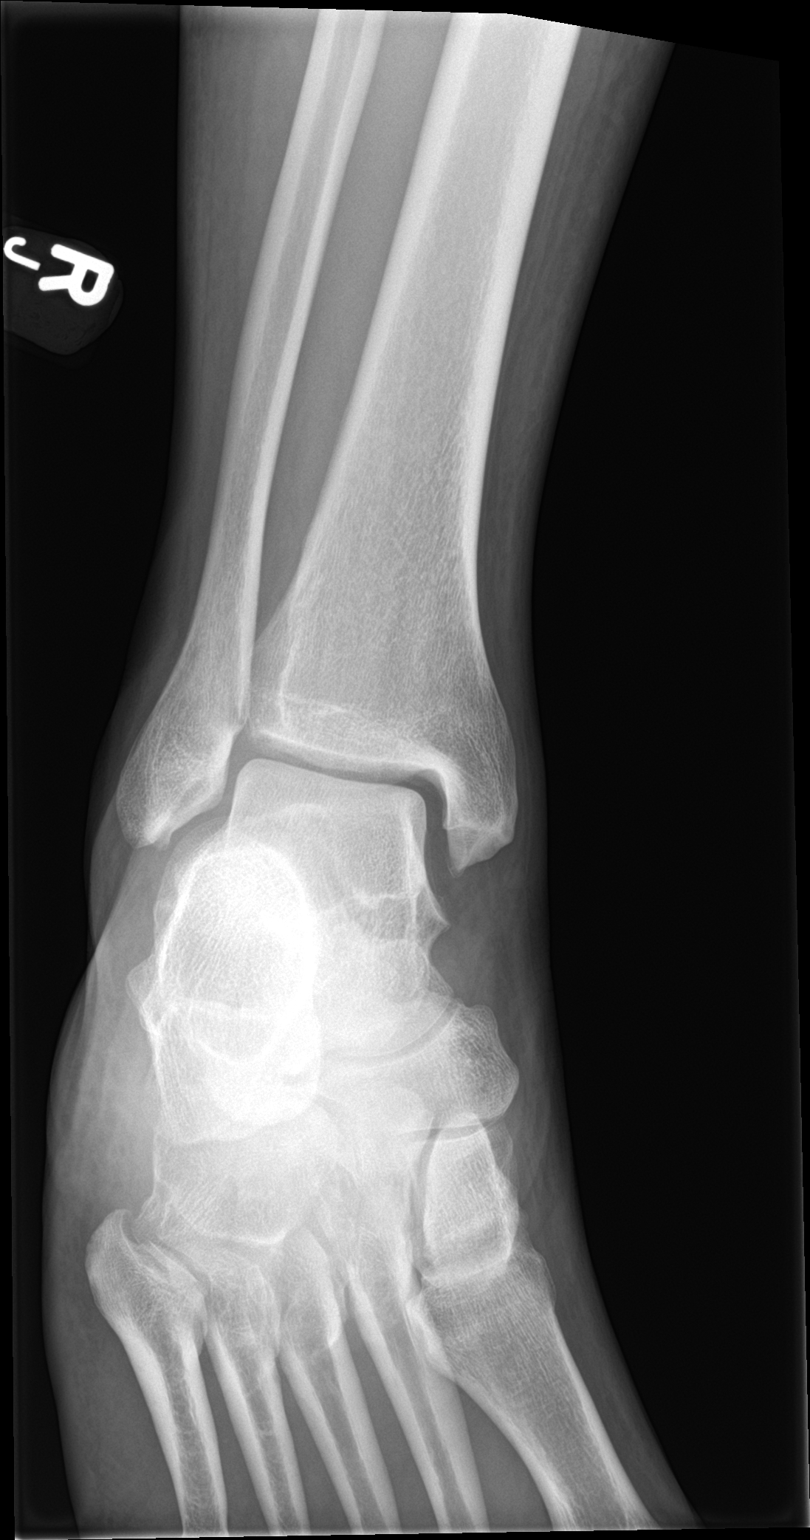

[ankle lat]
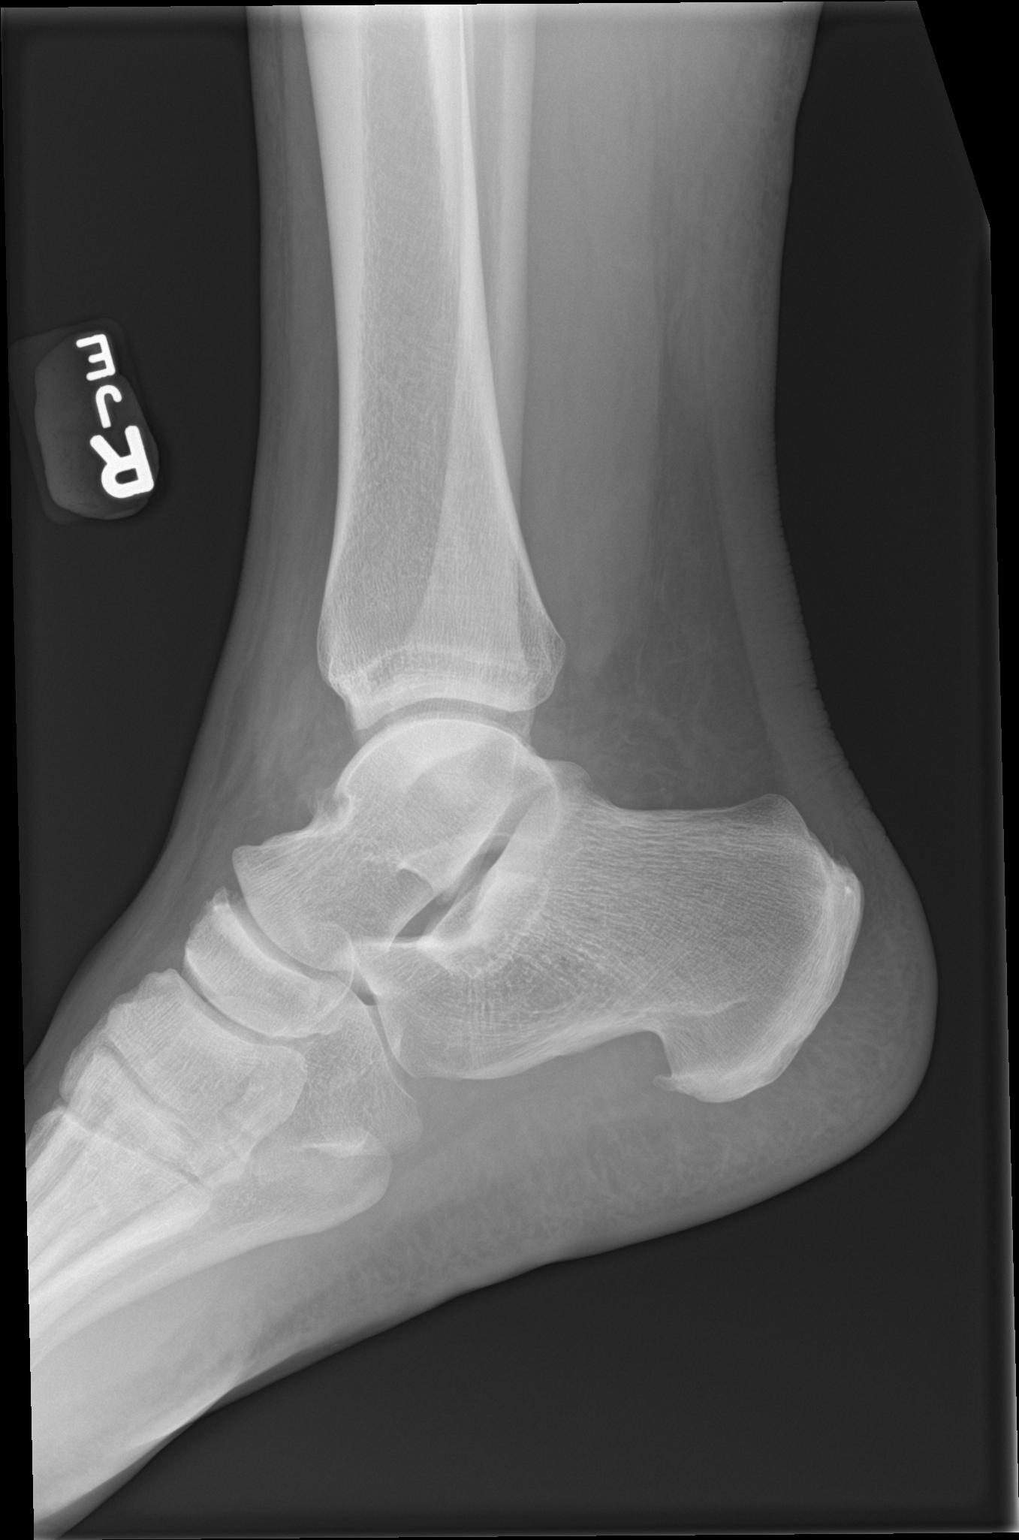

[3 of 3 positions shown; findings below may reference images not displayed]

FINDINGS: There is no evidence of fracture, dislocation, or joint effusion.
There is no evidence of arthropathy or other focal bone abnormality.
There is mild anterior and lateral soft tissue swelling.
IMPRESSION: Mild anterior and lateral soft tissue swelling, without an acute
osseous abnormality.

## 2023-07-19 IMAGING — MR MR ANKLE*R* W/O CM
4 of 5 series · 12 of 40 positions shown · non-contrast
Comparison: Right ankle radiographs 04/17/2021

CLINICAL DATA: Toe pain. Osteochondral lesion suspected. Negative
x-ray. Injured when slipped on a toy. Pain since [DATE].

EXAM:
MRI OF THE RIGHT ANKLE WITHOUT CONTRAST
TECHNIQUE: Multiplanar, multisequence MR imaging of the ankle was performed. No
intravenous contrast was administered.

[Series 3: PD fat-sat · axial · right · 3.0mm · 0.25mm/px · z∈[-116,-20]mm · 3 of 32 slices shown]
[im 4/32]
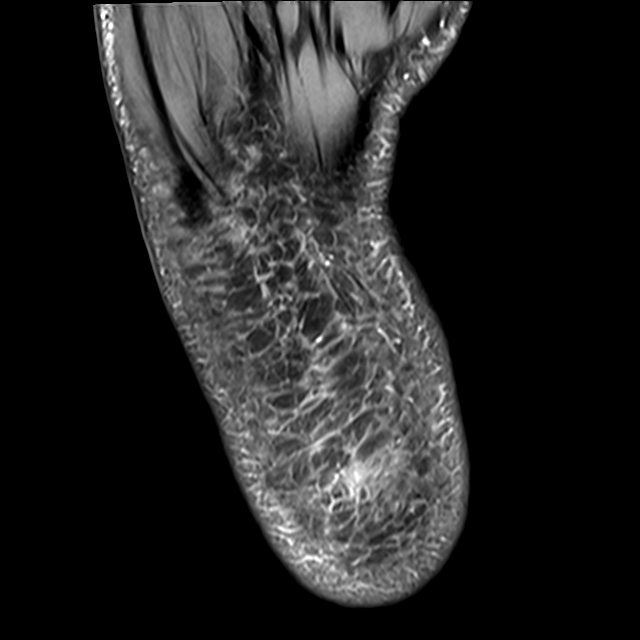
[im 16/32]
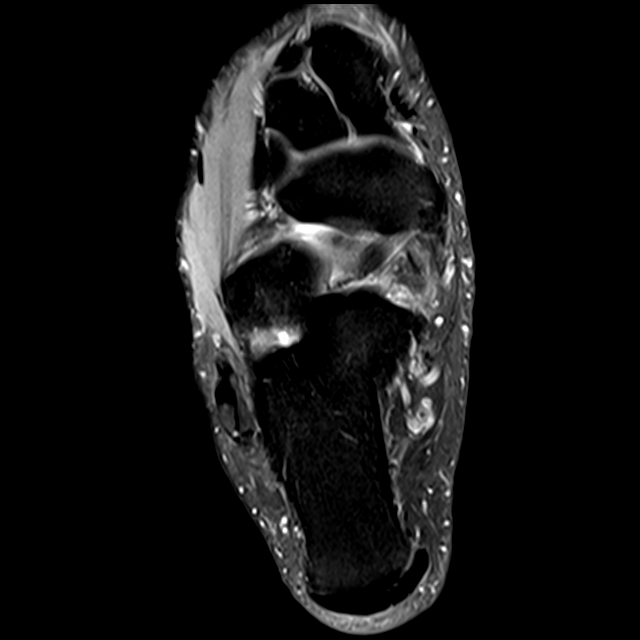
[im 28/32]
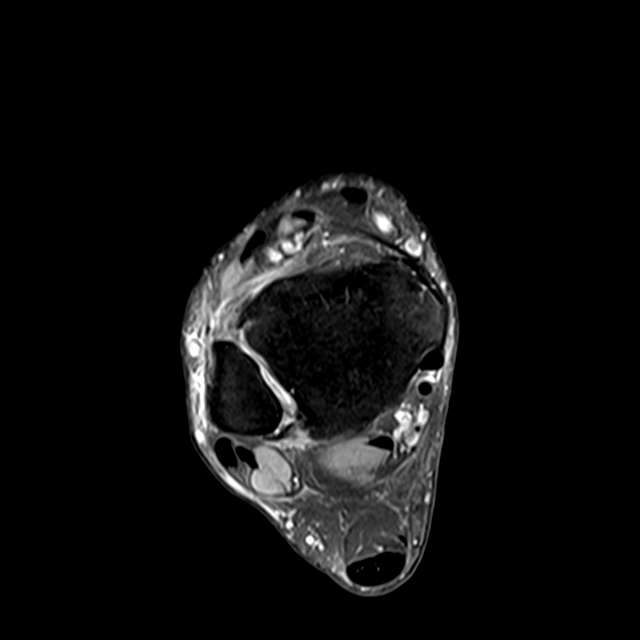

[Series 4: T2 fat-sat · axial · right · 3.0mm · 0.25mm/px · z∈[-116,-20]mm · 3 of 32 slices shown (1 of 2)]
[im 4/32]
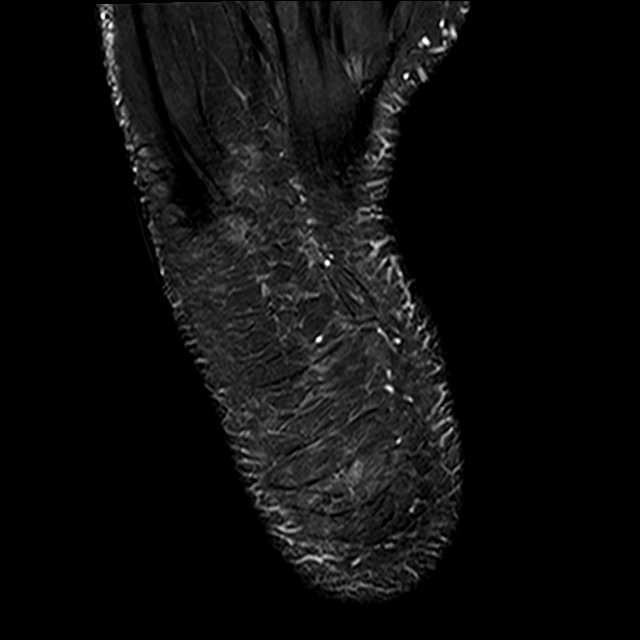
[im 16/32]
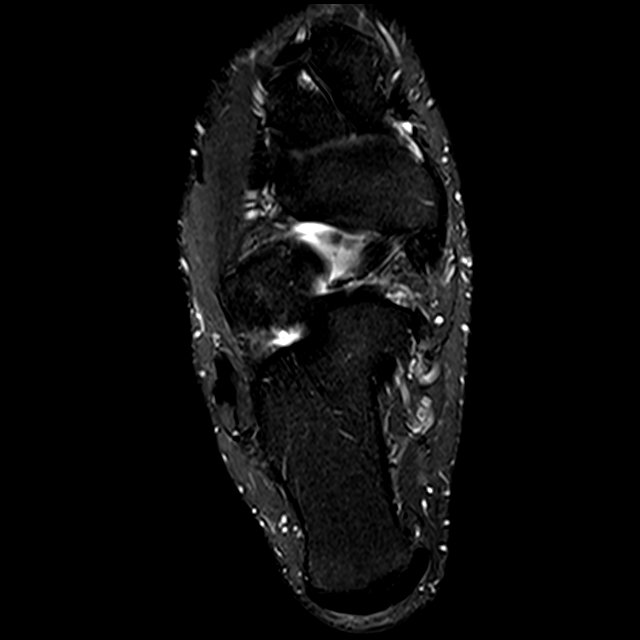
[im 28/32]
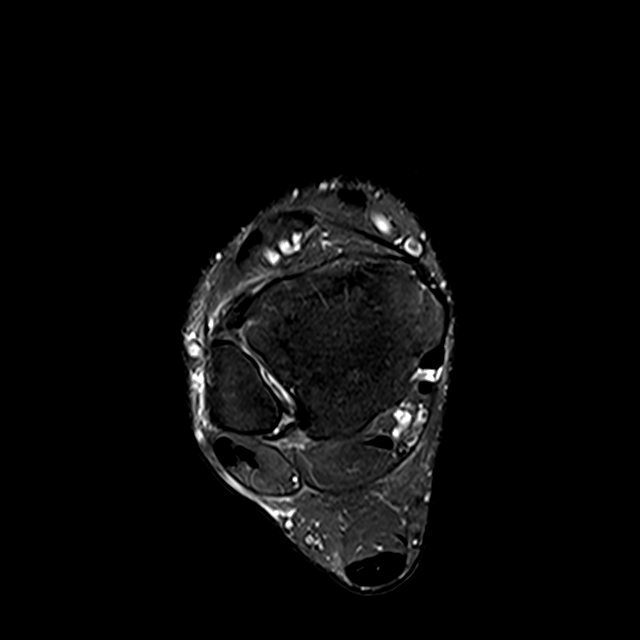

[Series 5: T1 · sagittal · right · 4.0mm · 0.27mm/px · 3 of 24 slices shown]
[im 4/24]
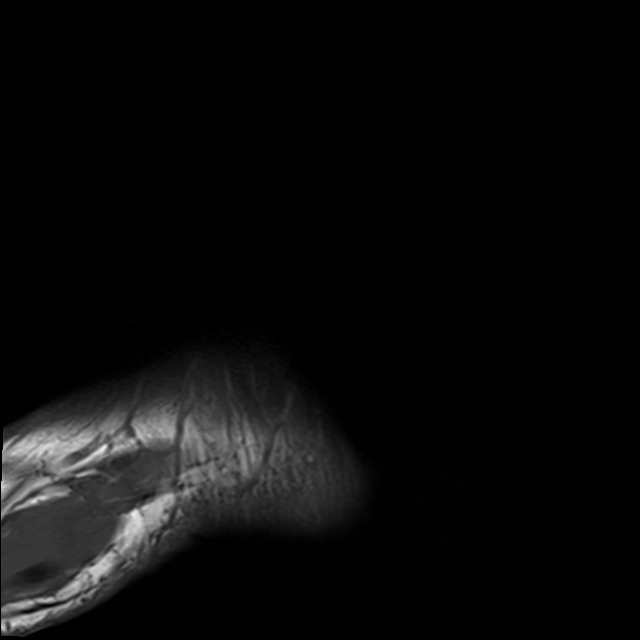
[im 12/24]
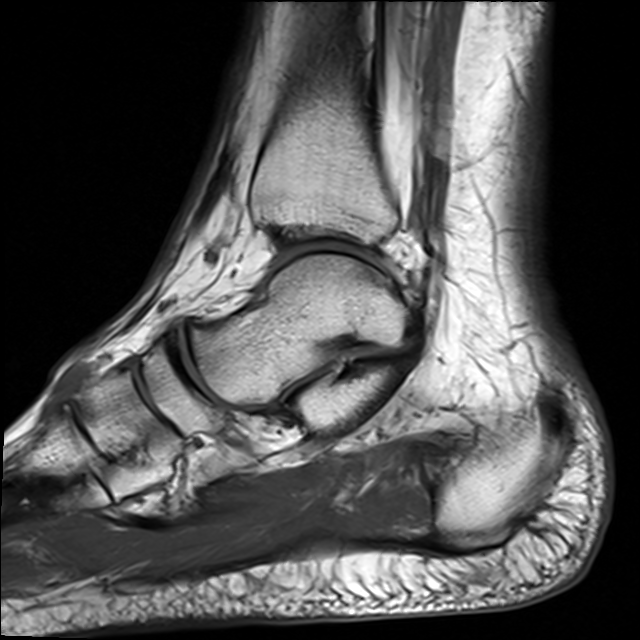
[im 20/24]
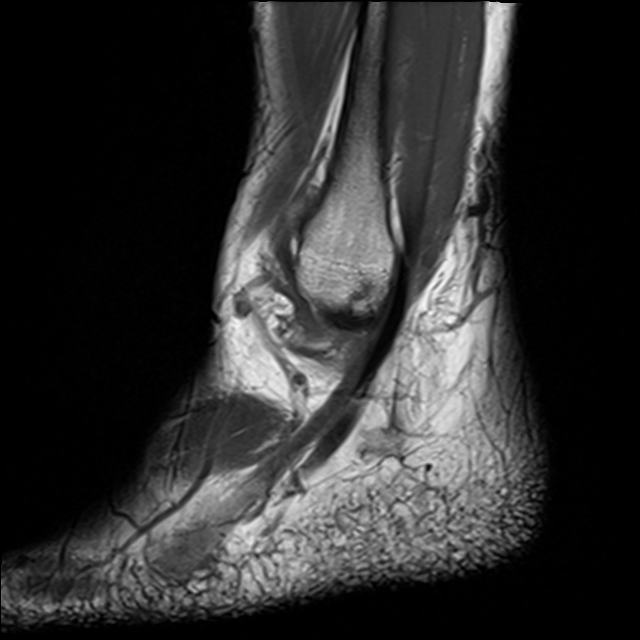

[Series 7: T2 fat-sat · coronal · right · 3.0mm · 0.25mm/px · 3 of 37 slices shown (2 of 2)]
[im 4/37]
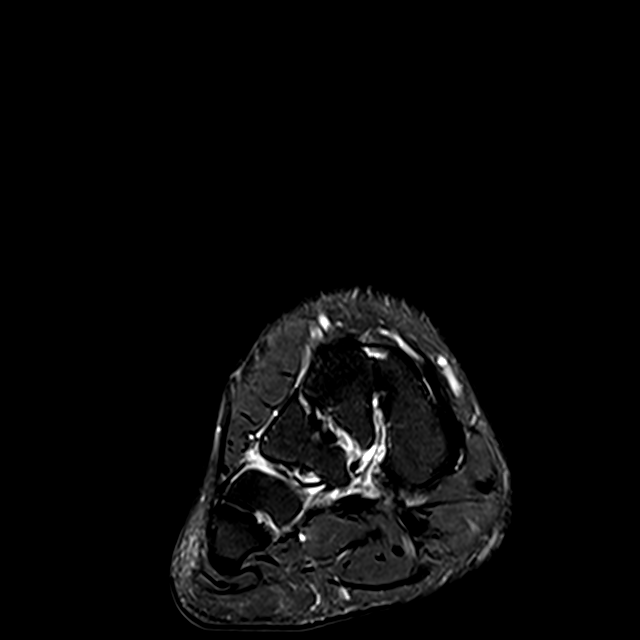
[im 19/37]
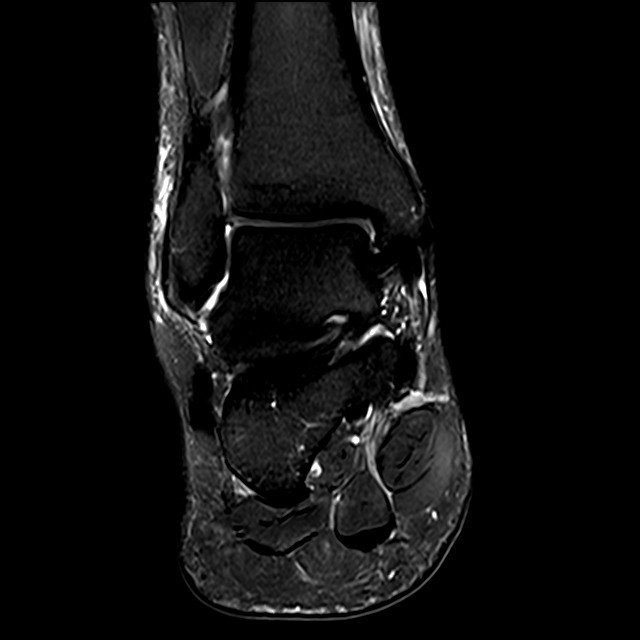
[im 33/37]
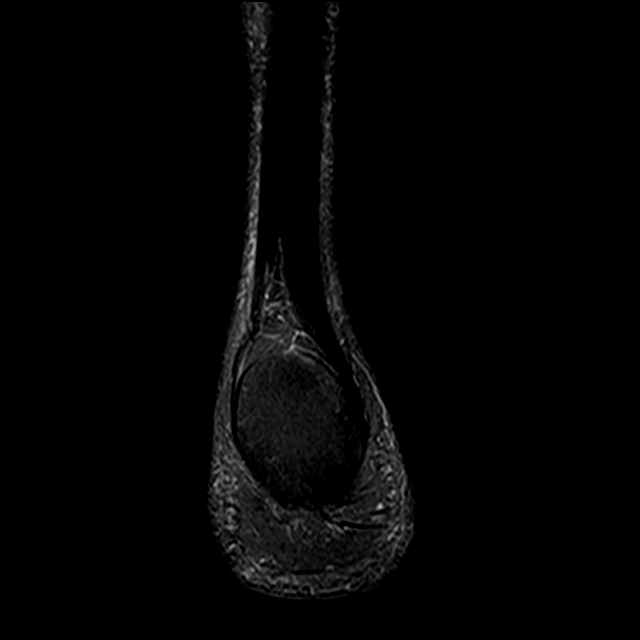

[12 of 40 positions shown; findings below may reference images not displayed]

FINDINGS: TENDONS

Peroneal: The peroneus longus and brevis tendons are intact.

Posteromedial: Intact tibialis posterior, flexor digitorum longus,
and flexor hallucis longus tendons.

Anterior: The tibialis anterior, extensor hallucis longus and
extensor digitorum longus tendons are intact.

Achilles: Intact.

Plantar Fascia: Small plantar calcaneal heel spur without marrow
edema. No plantar fasciitis.

LIGAMENTS

Lateral: The calcaneofibular ligament is intact. The anterior
talofibular ligament is not well visualized and likely chronically
torn (full-thickness). The posterior talofibular and posterior
tibiofibular ligaments are intact. Mild intermediate T2 signal
likely chronic sprain of the talar attachment of the anterior
talofibular ligament.

Medial: The tibiotalar deep deltoid and tibial spring ligaments are
intact.

CARTILAGE

Ankle Joint: There is mild cartilage thinning and focal subchondral
cystic change within the posterior mid transverse dimension of the
distal tibial plafond (coronal series 7, image 17). There is
additional moderate to high-grade thinning of the far posterior
tibial plafond cartilage diffusely (sagittal series 6 images 6
through 8) with mild subchondral marrow edema.

Subtalar Joints/Sinus Tarsi: Fat is preserved within sinus tarsi.

Bones: Mild dorsal talonavicular joint space narrowing and
peripheral osteophytosis osteoarthritis.

Other: The tarsal tunnel is unremarkable. The Lisfranc ligament
complex is intact.
IMPRESSION: :
IMPRESSION: 1. Small plantar calcaneal heel spur.  No plantar fasciitis.
2. Chronic full-thickness tear of the anterior talofibular ligament.
Mild chronic sprain of the talar attachment of the anterior
talofibular ligament.
3. Mild posterior distal tibial plafond cartilage degenerative
changes and subchondral marrow edema/cystic change.

## 2023-07-30 ENCOUNTER — Emergency Department (HOSPITAL_BASED_OUTPATIENT_CLINIC_OR_DEPARTMENT_OTHER)
Admission: EM | Admit: 2023-07-30 | Discharge: 2023-07-30 | Disposition: A | Discharge status: Home / Self Care | Payer: Self-pay

## 2023-07-30 ENCOUNTER — Encounter (HOSPITAL_BASED_OUTPATIENT_CLINIC_OR_DEPARTMENT_OTHER): Payer: Self-pay | Admitting: Urology

## 2023-07-30 ENCOUNTER — Other Ambulatory Visit: Payer: Self-pay

## 2023-07-30 DIAGNOSIS — M5441 Lumbago with sciatica, right side: Secondary | ICD-10-CM | POA: Insufficient documentation

## 2023-07-30 DIAGNOSIS — I10 Essential (primary) hypertension: Secondary | ICD-10-CM | POA: Insufficient documentation

## 2023-07-30 DIAGNOSIS — Z79899 Other long term (current) drug therapy: Secondary | ICD-10-CM | POA: Insufficient documentation

## 2023-07-30 MED ORDER — CYCLOBENZAPRINE HCL 10 MG PO TABS: 10.0000 mg | ORAL_TABLET | Freq: Two times a day (BID) | ORAL | 0 refills | Status: AC | PRN

## 2023-07-30 MED ORDER — PREDNISONE 10 MG (21) PO TBPK: ORAL_TABLET | Freq: Every day | ORAL | 0 refills | Status: AC

## 2023-07-30 MED ORDER — KETOROLAC TROMETHAMINE 30 MG/ML IJ SOLN
30.0000 mg | Freq: Once | INTRAMUSCULAR | Status: AC
  Administered 2023-07-30: 30 mg via INTRAMUSCULAR
  Filled 2023-07-30: qty 1

## 2023-07-30 MED ORDER — METHYLPREDNISOLONE SODIUM SUCC 125 MG IJ SOLR
60.0000 mg | Freq: Once | INTRAMUSCULAR | Status: AC
  Administered 2023-07-30: 60 mg via INTRAMUSCULAR
  Filled 2023-07-30: qty 2

## 2023-07-30 NOTE — Discharge Instructions (Signed)
 As discussed, we will place you on a steroid taper as well as muscle laxer for your pain. Muscle laxer can cause drowsiness so please do not drive or perform any high-risk activity and to realize its effects on you.  See information attached to your discharge papers regarding rehab exercises.  Recommend follow-up with your primary care for reassessment.

## 2023-07-30 NOTE — ED Provider Notes (Signed)
 [facility] Provider Note   CSN: [id number] Arrival date & time: 07/30/23  1649     Patient presents with: Muscle Pain   Dmetrius Ambs is a 48 y.o. male.    Muscle Pain   48 year old male presents emergency department complaints of right low back pain rating down to right knee.  States that symptoms began 5 days ago.  States that he was lifting heavy object when the symptoms began and persistent since onset.  Is on Lyrica secondary to chronic right foot/ankle pain.  States that he has been taking this medicine which has not been helping very much with his back.  Denies any saddle anesthesia, bowel/bladder dysfunction, fever, history of IV drug use, prolonged corticosteroid use, no malignancy, weakness/sensory deficits in lower extremities.  Denies any known fall/traumas.  Denies any urinary symptoms, abdominal pain.  Has taken Tylenol  in addition to his Lyrica which has not been helping very much.  Past medical history significant for hypertension, immune deficiency disorder  Prior to Admission medications   Medication Sig Start Date End Date Taking? Authorizing Provider  cyclobenzaprine (FLEXERIL) 10 MG tablet Take 1 tablet (10 mg total) by mouth 2 (two) times daily as needed for muscle spasms. 07/30/23  Yes Silver Fell A, PA  predniSONE (STERAPRED UNI-PAK 21 TAB) 10 MG (21) TBPK tablet Take by mouth daily. Take 6 tabs by mouth daily  for 2 days, then 5 tabs for 2 days, then 4 tabs for 2 days, then 3 tabs for 2 days, 2 tabs for 2 days, then 1 tab by mouth daily for 2 days 07/30/23  Yes Silver Fell A, PA  acetaminophen -codeine  (TYLENOL  #3) 300-30 MG tablet Take 1-2 tablets by mouth every 4 (four) hours as needed for moderate pain. 01/27/22   [name] SQUIBB, DPM  acetaminophen -codeine  (TYLENOL  #3) 300-30 MG tablet Take 1-2 tablets by mouth every 4 (four) hours as needed for moderate pain. 07/14/22   [name] SQUIBB, DPM  acetaminophen -codeine   (TYLENOL  #4) 300-60 MG tablet Take 1 tablet by mouth every 4 (four) hours as needed for moderate pain. 08/04/22   [name] SQUIBB, DPM  gabapentin  (NEURONTIN ) 300 MG capsule Take 1 capsule (300 mg total) by mouth 3 (three) times daily. 04/20/22   [name] SQUIBB, DPM  ibuprofen  (ADVIL ) 600 MG tablet Take 1 tablet (600 mg total) by mouth every 8 (eight) hours as needed. 04/03/21   Chick Venetia BRAVO, MD  naproxen  (NAPROSYN ) 375 MG tablet Take 1 tablet (375 mg total) by mouth 2 (two) times daily. 10/17/21   Freddi Hamilton, MD  oxyCODONE -acetaminophen  (PERCOCET) 10-325 MG tablet Take 1 tablet by mouth every 4 (four) hours as needed for pain. 12/01/22   [name] SQUIBB, DPM  oxyCODONE -acetaminophen  (PERCOCET) 5-325 MG tablet Take 1 tablet by mouth every 4 (four) hours as needed for severe pain. 08/25/22   [name] SQUIBB, DPM  oxyCODONE -acetaminophen  (PERCOCET) 5-325 MG tablet Take 1 tablet by mouth every 4 (four) hours as needed for severe pain. 09/29/22   [name] SQUIBB, DPM  oxyCODONE -acetaminophen  (PERCOCET) 5-325 MG tablet Take 1 tablet by mouth every 4 (four) hours as needed for severe pain. 10/27/22   [name] SQUIBB, DPM  oxyCODONE -acetaminophen  (PERCOCET/ROXICET) 5-325 MG tablet Take 1 tablet by mouth every 4 (four) hours as needed. 07/31/21   Harden Jerona GAILS, MD    Allergies: Onesimo and Spinach    Review of Systems  All other systems reviewed and are negative.  Updated Vital Signs BP (!) 141/102 (BP Location: Left Arm)   Pulse 82   Temp 98.5 F (36.9 C)   Resp 16   Ht 6' (1.829 m)   Wt 115.7 kg   SpO2 98%   BMI 34.59 kg/m   Physical Exam Vitals and nursing note reviewed.  Constitutional:      General: He is not in acute distress.    Appearance: He is well-developed.  HENT:     Head: Normocephalic and atraumatic.   Eyes:     Conjunctiva/sclera: Conjunctivae normal.    Cardiovascular:     Rate and Rhythm: Normal rate and regular rhythm.     Heart sounds: No murmur heard. Pulmonary:      Effort: Pulmonary effort is normal. No respiratory distress.     Breath sounds: Normal breath sounds.  Abdominal:     Palpations: Abdomen is soft.     Tenderness: There is no abdominal tenderness.   Musculoskeletal:        General: No swelling.     Cervical back: Neck supple.     Comments: No midline tenderness lumbar spine.  Paraspinal tenderness in the right lumbar region with tenderness of right buttock.  Straight leg raise positive on the right.  Muscular strength 5 out of 5 lower extremities.  No sensory deficits along major nerve distributions of lower extremities.  DTR symmetric 2+ at patella as well as Achilles.  Pedal and posterior tibial pulses 2+ bilaterally.  Patient ambulates in room without assistance or obvious gait deviation.   Skin:    General: Skin is warm and dry.     Capillary Refill: Capillary refill takes less than 2 seconds.   Neurological:     Mental Status: He is alert.   Psychiatric:        Mood and Affect: Mood normal.     (all labs ordered are listed, but only abnormal results are displayed) Labs Reviewed - No data to display  EKG: None  Radiology: No results found.   Procedures   Medications Ordered in the ED  ketorolac  (TORADOL ) 30 MG/ML injection 30 mg (has no administration in time range)  methylPREDNISolone  sodium succinate (SOLU-MEDROL ) 125 mg/2 mL injection 60 mg (has no administration in time range)                                    Medical Decision Making Risk Prescription drug management.   This patient presents to the ED for concern of pain, this involves an extensive number of treatment options, and is a complaint that carries with it a high risk of complications and morbidity.  The differential diagnosis includes fracture, strain/pain, dislocation, sciatica, cauda equina, spinal epidural abscess, aortic dissection, pyelonephritis, nephrolithiasis, other   Co morbidities that complicate the patient evaluation  See  HPI   Additional history obtained:  Additional history obtained from EMR External records from outside source obtained and reviewed including hospital records   Lab Tests:  N/a   Imaging Studies ordered:  N/a   Cardiac Monitoring: / EKG:  N/a   Consultations Obtained:  N/a   Problem List / ED Course / Critical interventions / Medication management  Right low back pain I ordered medication including Toradol , Solu-Medrol    Reevaluation of the patient after these medicines showed that the patient improved I have reviewed the patients home medicines and have made adjustments as needed   Social Determinants  of Health:  Denies tobacco, licit drug use.   Test / Admission - Considered:  Right low back pain Vitals signs significant for pretension blood pressure 141/102. Otherwise within normal range and stable throughout visit. 48 year old male presents emergency department complaints of right low back pain rating down to right knee.  States that symptoms began 5 days ago.  States that he was lifting heavy object when the symptoms began and persistent since onset.  Is on Lyrica secondary to chronic right foot/ankle pain.  States that he has been taking this medicine which has not been helping very much with his back.  Denies any saddle anesthesia, bowel/bladder dysfunction, fever, history of IV drug use, prolonged corticosteroid use, no malignancy, weakness/sensory deficits in lower extremities.  Denies any known fall/traumas.  Denies any urinary symptoms, abdominal pain.  Has taken Tylenol  in addition to his Lyrica which has not been helping very much. On exam, paraspinal tenderness of the right lower lumbar region with tenderness of right buttock.  Straight leg raise positive on the right.  No red flags signs or back pain on HPI/PE; low suspicion for cauda equina, spinal epidural abscess, other spinal cord compression/impingement.  No traumatic mechanism or obvious bony  tenderness on exam to be suspicious for fracture/dislocation.  Patient without urinary symptoms, CVA tenderness; low suspicion for pyelonephritis, nephrolithiasis.  Patient without abdominal pain rating to back, pulse deficits, neurodeficits, hypertension; low suspicion for aortic dissection.  Suspect right-sided sciatica as cause of symptoms.  Will trial steroid taper, rehab exercises and recommend follow-up with PCP.  Treatment plan discussed with patient and he acknowledged understanding was agreeable to said plan.  Patient overall well-appearing, afebrile in no acute distress. Worrisome signs and symptoms were discussed with the patient, and the patient acknowledged understanding to return to the ED if noticed. Patient was stable upon discharge.       Final diagnoses:  Acute right-sided low back pain with right-sided sciatica    ED Discharge Orders          Ordered    predniSONE (STERAPRED UNI-PAK 21 TAB) 10 MG (21) TBPK tablet  Daily        07/30/23 1721    cyclobenzaprine (FLEXERIL) 10 MG tablet  2 times daily PRN        07/30/23 1721               [name] LABOR, [place] 07/30/23 1751    Ula Prentice SAUNDERS, MD 07/30/23 2326

## 2023-07-30 NOTE — ED Triage Notes (Signed)
 Pt states hurt back 5 days ago after heavy lifting, states pain radiating from right side back to right groin and down to right thigh  Pain with sitting   On lyrica and goes to pain clinic

## 2023-08-09 ENCOUNTER — Encounter (HOSPITAL_BASED_OUTPATIENT_CLINIC_OR_DEPARTMENT_OTHER): Payer: Self-pay

## 2023-08-09 ENCOUNTER — Emergency Department (HOSPITAL_BASED_OUTPATIENT_CLINIC_OR_DEPARTMENT_OTHER)
Admission: EM | Admit: 2023-08-09 | Discharge: 2023-08-09 | Disposition: A | Discharge status: Home / Self Care | Payer: Self-pay | Attending: Emergency Medicine | Admitting: Emergency Medicine

## 2023-08-09 ENCOUNTER — Other Ambulatory Visit: Payer: Self-pay

## 2023-08-09 DIAGNOSIS — M545 Low back pain, unspecified: Secondary | ICD-10-CM | POA: Insufficient documentation

## 2023-08-09 DIAGNOSIS — R1030 Lower abdominal pain, unspecified: Secondary | ICD-10-CM | POA: Insufficient documentation

## 2023-08-09 DIAGNOSIS — X500XXA Overexertion from strenuous movement or load, initial encounter: Secondary | ICD-10-CM | POA: Insufficient documentation

## 2023-08-09 DIAGNOSIS — Y990 Civilian activity done for income or pay: Secondary | ICD-10-CM | POA: Insufficient documentation

## 2023-08-09 MED ORDER — KETOROLAC TROMETHAMINE 30 MG/ML IJ SOLN
15.0000 mg | Freq: Once | INTRAMUSCULAR | Status: AC
  Administered 2023-08-09: 15 mg via INTRAMUSCULAR
  Filled 2023-08-09: qty 1

## 2023-08-09 MED ORDER — METHOCARBAMOL 500 MG PO TABS: 500.0000 mg | ORAL_TABLET | Freq: Two times a day (BID) | ORAL | 0 refills | Status: AC

## 2023-08-09 MED ORDER — ACETAMINOPHEN 500 MG PO TABS
1000.0000 mg | ORAL_TABLET | Freq: Once | ORAL | Status: AC
  Administered 2023-08-09: 1000 mg via ORAL
  Filled 2023-08-09: qty 2

## 2023-08-09 NOTE — Discharge Instructions (Addendum)
 Your back pain is most likely due to a muscular strain.  There is been a lot of research on back pain, unfortunately the only thing that seems to really help is Tylenol  and ibuprofen .  Relative rest is also important to not lift greater than 10 pounds bending or twisting at the waist.  Please follow-up with your family physician.  The other thing that really seems to benefit patients is physical therapy which your doctor may send you for.  Please return to the emergency department for new numbness or weakness to your arms or legs. Difficulty with urinating or urinating or pooping on yourself.  Also if you cannot feel toilet paper when you wipe or get a fever.   [email or web address]  Take 4 over the counter ibuprofen  tablets 3 times a day or 2 over-the-counter naproxen  tablets twice a day for pain. Also take tylenol  1000mg (2 extra strength) four times a day. Robaxin, a muscle relaxant, was called in to your pharmacy. Can take 1, 500 mg Robaxin tablet, twice a day.

## 2023-08-09 NOTE — ED Provider Notes (Addendum)
 [facility] Provider Note   CSN: [id number] Arrival date & time: 08/09/23  9083     Patient presents with: Groin Pain   Edward Benjamin is a 48 y.o. male.   48 year old man presents with right low back pain. He was last seen in the ED for the same complaint on June 21. At that visit the patient was given steroid and ketorolac  injections in the back which helped with pain for 24 hours and then the pain returned. He reports exacerbated pain with movements, it started in his right lower back and has spread to his right anterior thigh but doesn't go past his right knee. He can feel his right thigh muscle spasm. 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 He says the cyclobenzaprine  does not help the pain.   He had a traumatic right ankle injury at work but has since had surgery and takes gabapentin  for nerve pain.   The history is provided by the patient.  Groin Pain The current episode started more than 1 week ago. Episode frequency: gets better with movement, worse when sitting for long periods of time. The problem has not changed since onset.Associated symptoms comments: Otherwise feels well. He has a good appetite, his sleep is disrupted due to back pain, normal bowel movements, and able to urinate normally. . The symptoms are aggravated by bending, twisting, coughing and walking. The symptoms are relieved by position.       Prior to Admission medications   Medication Sig Start Date End Date Taking? Authorizing Provider  acetaminophen -codeine  (TYLENOL  #3) 300-30 MG tablet Take 1-2 tablets by mouth every 4 (four) hours as needed for moderate pain. 01/27/22   [name] SQUIBB, DPM  acetaminophen -codeine  (TYLENOL  #3) 300-30 MG tablet Take 1-2 tablets by mouth every 4 (four) hours as needed for moderate pain. 07/14/22   [name] SQUIBB, DPM  acetaminophen -codeine  (TYLENOL  #4) 300-60 MG tablet Take 1 tablet  by mouth every 4 (four) hours as needed for moderate pain. 08/04/22   [name] SQUIBB, DPM  cyclobenzaprine  (FLEXERIL ) 10 MG tablet Take 1 tablet (10 mg total) by mouth 2 (two) times daily as needed for muscle spasms. 07/30/23   [name] LABOR, PA  gabapentin  (NEURONTIN ) 300 MG capsule Take 1 capsule (300 mg total) by mouth 3 (three) times daily. 04/20/22   [name] SQUIBB, DPM  ibuprofen  (ADVIL ) 600 MG tablet Take 1 tablet (600 mg total) by mouth every 8 (eight) hours as needed. 04/03/21   Chick Venetia BRAVO, MD  naproxen  (NAPROSYN ) 375 MG tablet Take 1 tablet (375 mg total) by mouth 2 (two) times daily. 10/17/21   Freddi Hamilton, MD  oxyCODONE -acetaminophen  (PERCOCET) 10-325 MG tablet Take 1 tablet by mouth every 4 (four) hours as needed for pain. 12/01/22   [name] SQUIBB, DPM  oxyCODONE -acetaminophen  (PERCOCET) 5-325 MG tablet Take 1 tablet by mouth every 4 (four) hours as needed for severe pain. 08/25/22   [name] SQUIBB, DPM  oxyCODONE -acetaminophen  (PERCOCET) 5-325 MG tablet Take 1 tablet by mouth every 4 (four) hours as needed for severe pain. 09/29/22   [name] SQUIBB, DPM  oxyCODONE -acetaminophen  (PERCOCET) 5-325 MG tablet Take 1 tablet by mouth every 4 (four) hours as needed for severe pain. 10/27/22   [name] SQUIBB, DPM  oxyCODONE -acetaminophen  (PERCOCET/ROXICET) 5-325 MG tablet Take 1 tablet by mouth every 4 (four) hours as needed. 07/31/21   [name]  V, MD  predniSONE  (STERAPRED UNI-PAK 21 TAB) 10 MG (21) TBPK tablet Take by mouth daily. Take 6 tabs by mouth daily  for 2 days, then 5 tabs for 2 days, then 4 tabs for 2 days, then 3 tabs for 2 days, 2 tabs for 2 days, then 1 tab by mouth daily for 2 days 07/30/23   [name] LABOR, PA    Allergies: Onesimo and Spinach    Review of Systems  Updated Vital Signs BP 129/85 (BP Location: Right Arm)   Pulse 78   Temp 98.7 F (37.1 C) (Oral)   Resp 16   Ht 6' (1.829 m)   Wt 115.7 kg   SpO2 96%   BMI 34.59 kg/m   Physical  Exam Constitutional:      Appearance: Normal appearance.  Abdominal:     General: There is no distension. There are no signs of injury.     Tenderness: There is no right CVA tenderness.   Musculoskeletal:     Comments: Normal range of motion in the right ankle Bilateral sensation in both thighs, legs, and feet Positive straight leg test on the right Right inguinal pain with applied pressure over adductor longus  No bony abnormality or producible pain over spinous processes    Neurological:     General: No focal deficit present.     Mental Status: He is alert.     (all labs ordered are listed, but only abnormal results are displayed) Labs Reviewed - No data to display  EKG: None  Radiology: No results found.   Procedures   Medications Ordered in the ED - No data to display                                  Medical Decision Making Risk OTC drugs. Prescription drug management.   This patient presents to the ED for concern of right lower back pain, this involves an extensive number of treatment options, and is a complaint that carries with it a high risk of complications and morbidity.  The differential diagnosis includes muscle strain/sprain, herniated disc with radiculopathy, inguinal hernia, pyelonephritis, cauda equina syndrome, abdominal aortic aneurysm. Mass not palpated on palpation of inguinal canal making inguinal hernia less likely. Reproducible pain over adductor muscles on pubic symphysis may be attributed to muscle strain.  Patient denies anal numbness making cauda equina less likely. He denies fever or chills making infection less likely. Patient does not have midline abdomen tenderness making aortic aneurysm less likely. Patient denies history of traumatic accident.    Co morbidities / Chronic conditions that complicate the patient evaluation  History of right ankle injuries with subsequent surgical therapy, now with normal range of motion. Managed with  gabapentin .    Additional history obtained:  Additional history obtained from EMR from last ER visit on 6/21.   Lab Tests:  None ordered.    Imaging Studies ordered:  None ordered.     Problem List / ED Course / Critical interventions / Medication management  Musculoskeletal, right lower back pain I ordered medication including ketorolac  15 mg IM and acetaminophen  1000 mg. Reevaluation of the patient after these medicines showed that the patient understands that he is to perform at home stretches and follow up with a sports medicine clinic.  I have reviewed the patients home medicines and have made adjustments as needed. The patient was counseled on the limited benefit of medications  helping the pain. The patient was recommended stretches and provided a link to a youtube for at home stretches.    Social Determinants of Health:  The patient has a job, significant other, and 6 children.       Final diagnoses:  None    ED Discharge Orders     None          Charmayne Holmes, DO 08/09/23 1129    Charmayne Holmes, DO 08/09/23 1129    Emil Share, DO 08/09/23 1418

## 2023-08-09 NOTE — ED Triage Notes (Signed)
 Started having right back pain radiating down leg a month ago, states now started having pain in right groin x 2 weeks. Seen here on 6/21, took medications with relief for 2 days, pain returned. Denies urinary symptoms. Pain worse with movement.

## 2023-10-24 ENCOUNTER — Ambulatory Visit (INDEPENDENT_AMBULATORY_CARE_PROVIDER_SITE_OTHER): Payer: Worker's Compensation | Admitting: Orthopedic Surgery

## 2023-10-24 ENCOUNTER — Encounter: Payer: Self-pay | Admitting: Orthopedic Surgery

## 2023-10-24 DIAGNOSIS — G90521 Complex regional pain syndrome I of right lower limb: Secondary | ICD-10-CM | POA: Diagnosis not present

## 2023-10-24 NOTE — Progress Notes (Signed)
 Office Visit Note   Patient: Edward Benjamin           Date of Birth: 07/04/1975           MRN: [id number] Visit Date: 10/24/2023              Requested by: No referring provider defined for this encounter. PCP: Pcp, No  Chief Complaint  Patient presents with   Right Foot - Pain      HPI: Discussed the use of AI scribe software for clinical note transcription with the patient, who gave verbal consent to proceed.  History of Present Illness Edward Benjamin is a 48 year old male who presents with neuropathic pain in the right foot following a traumatic injury. He was referred by Dr. Nancey for evaluation of persistent neuropathic pain after a traumatic foot injury.  He experienced a traumatic injury to his right foot on November 24, 2021, when a pole fell on it. Since then, he has had significant pain primarily on the bottom of his foot, right behind his toes, extending to the top of his foot. The pain is described as 'crucial' and is exacerbated by walking or attempting to jog, causing him to walk on his heels to avoid pressure on his toes.  He has undergone various treatments including injections, Neurontin  (gabapentin ), nerve conduction studies, PRP injections, and a nerve stimulator for pain management. Despite these interventions, he continues to experience severe pain, particularly when pressure is applied to his toes during walking, which he describes as debilitating.  Radiographs from January 27, 2022, showed no displacement across the Lisfranc complex and no fractures. 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  His ankle pain resolved completely after an ankle arthroscopy, but he has had persistent pain on the top and bottom of his foot since the blunt trauma to the dorsum of his foot. No numbness across the front of his foot, but he  experiences pain when pressure is applied to his toes.  He has been on multiple medications over the past year and a half, including Neurontin , but none have provided significant relief. He experienced temporary relief from pain after receiving a shot, which allowed him to walk and jog briefly before the pain returned.     Assessment & Plan: Visit Diagnoses:  1. Complex regional pain syndrome type 1 of right lower extremity     Plan: Assessment and Plan Assessment & Plan Right foot neuropathic pain involving superficial and deep peroneal nerves Chronic neuropathic pain in the right foot post-trauma, affecting superficial and deep peroneal nerves. No dystrophy or skin changes. Pain worsens with pressure and walking. Previous treatments ineffective. Imaging confirms nerve injury without instability. Sympathetic block prioritized over other interventions. - Proceed with sympathetic block. - Avoid spinal cord stimulator unless sympathetic block fails. - Discuss risks of nerve cutting or burning. - Consult pain management specialist.  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 No instability or significant bony abnormalities.      Follow-Up Instructions: No follow-ups on file.   Ortho Exam  Patient is alert, oriented, no adenopathy, well-dressed, normal affect, normal respiratory effort. Physical Exam CARDIOVASCULAR: Palpable pulse. MUSCULOSKELETAL: Good ankle and subtalar motion. No pain to palpation of the ankle. No pain with distraction across the Lisfranc complex. No ligamentous or bony Lisfranc injury.  NEUROLOGICAL: No decreased sensation. No pain to light touch with palpation. Dull numbness after palpation. Sensation intact in deep and superficial peroneal nerve distribution. Numbness in deep and superficial peroneal nerve distribution. No hypersensitivity to light touch, no skin color or temperature changes after  palpation.      Imaging: No results found. No images are attached to the encounter.  Labs: No results found for: HGBA1C, ESRSEDRATE, CRP, LABURIC, REPTSTATUS, GRAMSTAIN, CULT, LABORGA   No results found for: ALBUMIN, PREALBUMIN, CBC  No results found for: MG No results found for: VD25OH  No results found for: PREALBUMIN    Latest Ref Rng & Units 07/31/2021   10:16 AM  CBC EXTENDED  WBC 4.0 - 10.5 K/uL 6.0   RBC 4.22 - 5.81 MIL/uL 5.21   Hemoglobin 13.0 - 17.0 g/dL 86.0   HCT 60.9 - 47.9 % 42.8   Platelets 150 - 400 K/uL 271      There is no height or weight on file to calculate BMI.  Orders:  No orders of the defined types were placed in this encounter.  No orders of the defined types were placed in this encounter.    Procedures: No procedures performed  Clinical Data: No additional findings.  ROS:  All other systems negative, except as noted in the HPI. Review of Systems  Objective: Vital Signs: There were no vitals taken for this visit.  Specialty Comments:  No specialty comments available.  PMFS History: Patient Active Problem List   Diagnosis Date Noted   Primary osteoarthritis of right ankle 05/18/2021   Capsulitis of right shoulder 05/06/2021   Bone marrow edema 04/03/2021   Past Medical History:  Diagnosis Date   Hypertension    Immune deficiency disorder (HCC)     History reviewed. No pertinent family history.  Past Surgical History:  Procedure Laterality Date   ANKLE ARTHROSCOPY Right 07/31/2021   Procedure: RIGHT ANKLE ARTHROSCOPY, DEBRIDEMENT;  Surgeon: Harden Jerona GAILS, MD;  Location: [facility] OR;  Service: Orthopedics;  Laterality: Right;   KNEE SURGERY     Social History   Occupational History   Not on file  Tobacco Use   Smoking status: Never   Smokeless tobacco: Never  Vaping Use   Vaping status: Never Used  Substance and Sexual Activity   Alcohol use: Not Currently   Drug use: Never   Sexual  activity: Yes

## 2023-12-12 ENCOUNTER — Encounter: Payer: Self-pay | Admitting: Radiology

## 2024-01-01 ENCOUNTER — Other Ambulatory Visit: Payer: Self-pay

## 2024-01-01 ENCOUNTER — Emergency Department (HOSPITAL_BASED_OUTPATIENT_CLINIC_OR_DEPARTMENT_OTHER)
Admission: EM | Admit: 2024-01-01 | Discharge: 2024-01-01 | Disposition: A | Discharge status: Home / Self Care | Attending: Emergency Medicine | Admitting: Emergency Medicine

## 2024-01-01 ENCOUNTER — Encounter (HOSPITAL_BASED_OUTPATIENT_CLINIC_OR_DEPARTMENT_OTHER): Payer: Self-pay

## 2024-01-01 DIAGNOSIS — R110 Nausea: Secondary | ICD-10-CM | POA: Insufficient documentation

## 2024-01-01 DIAGNOSIS — R1084 Generalized abdominal pain: Secondary | ICD-10-CM | POA: Insufficient documentation

## 2024-01-01 DIAGNOSIS — Z79899 Other long term (current) drug therapy: Secondary | ICD-10-CM | POA: Diagnosis not present

## 2024-01-01 DIAGNOSIS — I10 Essential (primary) hypertension: Secondary | ICD-10-CM | POA: Insufficient documentation

## 2024-01-01 LAB — CBC
HCT: 45.5 % (ref 39.0–52.0)
Hemoglobin: 15 g/dL (ref 13.0–17.0)
MCH: 27.2 pg (ref 26.0–34.0)
MCHC: 33 g/dL (ref 30.0–36.0)
MCV: 82.4 fL (ref 80.0–100.0)
Platelets: 266 K/uL (ref 150–400)
RBC: 5.52 MIL/uL (ref 4.22–5.81)
RDW: 14 % (ref 11.5–15.5)
WBC: 7.9 K/uL (ref 4.0–10.5)
nRBC: 0 % (ref 0.0–0.2)

## 2024-01-01 LAB — COMPREHENSIVE METABOLIC PANEL WITH GFR
ALT: 35 U/L (ref 0–44)
AST: 31 U/L (ref 15–41)
Albumin: 4.7 g/dL (ref 3.5–5.0)
Alkaline Phosphatase: 60 U/L (ref 38–126)
Anion gap: 11 (ref 5–15)
BUN: 16 mg/dL (ref 6–20)
CO2: 23 mmol/L (ref 22–32)
Calcium: 9.1 mg/dL (ref 8.9–10.3)
Chloride: 106 mmol/L (ref 98–111)
Creatinine, Ser: 1.02 mg/dL (ref 0.61–1.24)
GFR, Estimated: >60 mL/min (ref >60)
Glucose, Bld: 106 mg/dL — ABNORMAL HIGH (ref 70–99)
Potassium: 4 mmol/L (ref 3.5–5.1)
Sodium: 140 mmol/L (ref 135–145)
Total Bilirubin: 0.6 mg/dL (ref 0.0–1.2)
Total Protein: 7 g/dL (ref 6.5–8.1)

## 2024-01-01 LAB — LIPASE, BLOOD: Lipase: 33 U/L (ref 11–51)

## 2024-01-01 MED ORDER — SODIUM CHLORIDE 0.9 % IV BOLUS
1000.0000 mL | Freq: Once | INTRAVENOUS | Status: AC
  Administered 2024-01-01: 1000 mL via INTRAVENOUS

## 2024-01-01 MED ORDER — ONDANSETRON HCL 4 MG/2ML IJ SOLN
4.0000 mg | Freq: Once | INTRAMUSCULAR | Status: AC
  Administered 2024-01-01: 4 mg via INTRAVENOUS
  Filled 2024-01-01: qty 2

## 2024-01-01 MED ORDER — ONDANSETRON 4 MG PO TBDP: 4.0000 mg | ORAL_TABLET | Freq: Three times a day (TID) | ORAL | 0 refills | Status: AC | PRN

## 2024-01-01 NOTE — ED Triage Notes (Signed)
 Pt states that he thinks he has food poisoning. Pt states that he has felt like he needed to vomit but hasn't been able to. Sxs started this afternoon.

## 2024-01-01 NOTE — Discharge Instructions (Signed)
 Zofran  as needed as prescribed for nausea and vomiting. Recheck with your primary care provider. Turn to the ER for worsening or concerning symptoms.

## 2024-01-01 NOTE — ED Notes (Signed)
 Patient provided a ginger ale for PO challenge.

## 2024-01-01 NOTE — ED Provider Notes (Signed)
 [facility] Provider Note   CSN: [id number] Arrival date & time: 01/01/24  8062     Patient presents with: Abdominal Pain   Edward Benjamin is a 48 y.o. male.   48 year old male with complaint of abdominal discomfort and nausea onset today. No vomiting. Exposed to his 48yo who is here with vomiting and diarrhea. No other complaints or concerns.        Prior to Admission medications   Medication Sig Start Date End Date Taking? Authorizing Provider  ondansetron  (ZOFRAN -ODT) 4 MG disintegrating tablet Take 1 tablet (4 mg total) by mouth every 8 (eight) hours as needed for nausea or vomiting. 01/01/24  Yes Beverley Leita LABOR, PA-C  acetaminophen -codeine  (TYLENOL  #3) 300-30 MG tablet Take 1-2 tablets by mouth every 4 (four) hours as needed for moderate pain. 01/27/22   [name] SQUIBB, DPM  acetaminophen -codeine  (TYLENOL  #3) 300-30 MG tablet Take 1-2 tablets by mouth every 4 (four) hours as needed for moderate pain. 07/14/22   [name] SQUIBB, DPM  acetaminophen -codeine  (TYLENOL  #4) 300-60 MG tablet Take 1 tablet by mouth every 4 (four) hours as needed for moderate pain. 08/04/22   [name] SQUIBB, DPM  cyclobenzaprine  (FLEXERIL ) 10 MG tablet Take 1 tablet (10 mg total) by mouth 2 (two) times daily as needed for muscle spasms. 07/30/23   [name] LABOR, PA  gabapentin  (NEURONTIN ) 300 MG capsule Take 1 capsule (300 mg total) by mouth 3 (three) times daily. 04/20/22   [name] SQUIBB, DPM  ibuprofen  (ADVIL ) 600 MG tablet Take 1 tablet (600 mg total) by mouth every 8 (eight) hours as needed. 04/03/21   Chick Venetia BRAVO, MD  methocarbamol  (ROBAXIN ) 500 MG tablet Take 1 tablet (500 mg total) by mouth 2 (two) times daily. 08/09/23   Charmayne Holmes, DO  naproxen  (NAPROSYN ) 375 MG tablet Take 1 tablet (375 mg total) by mouth 2 (two) times daily. 10/17/21   Freddi Hamilton, MD  oxyCODONE -acetaminophen  (PERCOCET) 10-325 MG tablet Take 1 tablet by mouth every 4 (four)  hours as needed for pain. 12/01/22   [name] SQUIBB, DPM  oxyCODONE -acetaminophen  (PERCOCET) 5-325 MG tablet Take 1 tablet by mouth every 4 (four) hours as needed for severe pain. 08/25/22   [name] SQUIBB, DPM  oxyCODONE -acetaminophen  (PERCOCET) 5-325 MG tablet Take 1 tablet by mouth every 4 (four) hours as needed for severe pain. 09/29/22   [name] SQUIBB, DPM  oxyCODONE -acetaminophen  (PERCOCET) 5-325 MG tablet Take 1 tablet by mouth every 4 (four) hours as needed for severe pain. 10/27/22   [name] SQUIBB, DPM  oxyCODONE -acetaminophen  (PERCOCET/ROXICET) 5-325 MG tablet Take 1 tablet by mouth every 4 (four) hours as needed. 07/31/21   Harden Jerona GAILS, MD  predniSONE  (STERAPRED UNI-PAK 21 TAB) 10 MG (21) TBPK tablet Take by mouth daily. Take 6 tabs by mouth daily  for 2 days, then 5 tabs for 2 days, then 4 tabs for 2 days, then 3 tabs for 2 days, 2 tabs for 2 days, then 1 tab by mouth daily for 2 days 07/30/23   [name] LABOR, PA    Allergies: Onesimo and Spinach    Review of Systems Negative except as per HPI Updated Vital Signs BP (!) 137/99 (BP Location: Right Arm)   Pulse 90   Temp 99.8 F (37.7 C) (Oral)   Resp (!) 22   Ht 6' (1.829 m)   Wt 117.9 kg   SpO2 99%   BMI 35.26  kg/m   Physical Exam Vitals and nursing note reviewed.  Constitutional:      General: He is not in acute distress.    Appearance: He is well-developed. He is not diaphoretic.  HENT:     Head: Normocephalic and atraumatic.  Cardiovascular:     Rate and Rhythm: Normal rate and regular rhythm.     Heart sounds: Normal heart sounds.  Pulmonary:     Effort: Pulmonary effort is normal.     Breath sounds: Normal breath sounds.  Abdominal:     Palpations: Abdomen is soft.     Tenderness: There is no abdominal tenderness.  Skin:    General: Skin is warm and dry.     Findings: No erythema or rash.  Neurological:     Mental Status: He is alert and oriented to person, place, and time.  Psychiatric:         Behavior: Behavior normal.     (all labs ordered are listed, but only abnormal results are displayed) Labs Reviewed  COMPREHENSIVE METABOLIC PANEL WITH GFR - Abnormal; Notable for the following components:      Result Value   Glucose, Bld 106 (*)    All other components within normal limits  LIPASE, BLOOD  CBC    EKG: None  Radiology: No results found.   Procedures   Medications Ordered in the ED  sodium chloride  0.9 % bolus 1,000 mL (0 mLs Intravenous Stopped 01/01/24 2104)  ondansetron  (ZOFRAN ) injection 4 mg (4 mg Intravenous Given 01/01/24 2028)                                    Medical Decision Making Amount and/or Complexity of Data Reviewed Labs: ordered.  Risk Prescription drug management.   This patient presents to the ED for concern of nausea, abdominal pain, this involves an extensive number of treatment options, and is a complaint that carries with it a high risk of complications and morbidity.  The differential diagnosis includes viral illness, gastritis, colitis, appendicitis, pancreatitis    Co morbidities / Chronic conditions that complicate the patient evaluation  HTN, immune deficiency disorder    Additional history obtained:  Additional history obtained from EMR External records from outside source obtained and reviewed including prior labs/imaging on file    Lab Tests:  I Ordered, and personally interpreted labs.  The pertinent results include:  lipase WNL, CBC and CMP without significant findings    Problem List / ED Course / Critical interventions / Medication management  48 yo male with complaint of abdominal pain and nausea, exposed to child with vomiting and diarrhea. Abdomen is soft and non tender. Labs reassuring. Feeling better after zofran  and tolerating POs.  I ordered medication including Zofran , IVF   Reevaluation of the patient after these medicines showed that the patient feeling improved, tolerating POs I have reviewed  the patients home medicines and have made adjustments as needed   Social Determinants of Health:  Lives with family   Test / Admission - Considered:  Stable for dc      Final diagnoses:  Generalized abdominal pain  Nausea    ED Discharge Orders          Ordered    ondansetron  (ZOFRAN -ODT) 4 MG disintegrating tablet  Every 8 hours PRN        01/01/24 2109  Beverley Leita LABOR, PA-C 01/01/24 2109    Lenor Hollering, MD 01/01/24 2258
